# Patient Record
Sex: Female | Born: 1985 | Race: White | Hispanic: No | Smoking: Current every day smoker
Health system: Southern US, Community
[De-identification: ages and names within clinical notes are randomized; demographics above are authoritative.]

## PROBLEM LIST (undated history)

## (undated) DIAGNOSIS — G8929 Other chronic pain: Secondary | ICD-10-CM

## (undated) DIAGNOSIS — M549 Dorsalgia, unspecified: Secondary | ICD-10-CM

## (undated) HISTORY — PX: TUBAL LIGATION: SHX77

## (undated) HISTORY — PX: VAGINA SURGERY: SHX829

---

## 2006-03-14 ENCOUNTER — Emergency Department (HOSPITAL_COMMUNITY): Admission: EM | Admit: 2006-03-14 | Discharge: 2006-03-14 | Payer: Self-pay | Admitting: Emergency Medicine

## 2006-03-15 ENCOUNTER — Emergency Department (HOSPITAL_COMMUNITY): Admission: EM | Admit: 2006-03-15 | Discharge: 2006-03-15 | Payer: Self-pay | Admitting: Emergency Medicine

## 2011-12-12 ENCOUNTER — Emergency Department (HOSPITAL_BASED_OUTPATIENT_CLINIC_OR_DEPARTMENT_OTHER)
Admission: EM | Admit: 2011-12-12 | Discharge: 2011-12-12 | Disposition: A | Discharge status: Home / Self Care | Payer: Self-pay | Attending: Emergency Medicine | Admitting: Emergency Medicine

## 2011-12-12 ENCOUNTER — Encounter (HOSPITAL_BASED_OUTPATIENT_CLINIC_OR_DEPARTMENT_OTHER): Payer: Self-pay | Admitting: *Deleted

## 2011-12-12 ENCOUNTER — Emergency Department (INDEPENDENT_AMBULATORY_CARE_PROVIDER_SITE_OTHER): Payer: Self-pay

## 2011-12-12 DIAGNOSIS — R222 Localized swelling, mass and lump, trunk: Secondary | ICD-10-CM

## 2011-12-12 DIAGNOSIS — R0781 Pleurodynia: Secondary | ICD-10-CM

## 2011-12-12 DIAGNOSIS — J45909 Unspecified asthma, uncomplicated: Secondary | ICD-10-CM | POA: Insufficient documentation

## 2011-12-12 DIAGNOSIS — R079 Chest pain, unspecified: Secondary | ICD-10-CM | POA: Insufficient documentation

## 2011-12-12 LAB — PREGNANCY, URINE: Preg Test, Ur: NEGATIVE

## 2011-12-12 NOTE — ED Provider Notes (Signed)
Medical screening examination/treatment/procedure(s) were performed by non-physician practitioner and as supervising physician I was immediately available for consultation/collaboration.  Cambelle Suchecki, MD 12/12/11 2357 

## 2011-12-12 NOTE — ED Notes (Signed)
Pt has lump over sternum x 1.5 months- has been evaluated by other emergency services without resolution

## 2011-12-12 NOTE — ED Provider Notes (Signed)
History     CSN: [id number]  Arrival date & time 12/12/11  1906   First MD Initiated Contact with Patient 12/12/11 2039      Chief Complaint  Patient presents with  . Mass    (Consider location/radiation/quality/duration/timing/severity/associated sxs/prior treatment) HPI Comments: Pt states that she has had a lump over the left lower rib at the base of the sternum:pt states that she noticed it about 1.5 months ago:pt states that it is tender  The history is provided by the patient. No language interpreter was used.    Past Medical History  Diagnosis Date  . Asthma     Past Surgical History  Procedure Date  . Vagina surgery     No family history on file.  History  Substance Use Topics  . Smoking status: Current Everyday Smoker  . Smokeless tobacco: Never Used  . Alcohol Use: Yes     1 x month    OB History    Grav Para Term Preterm Abortions TAB SAB Ect Mult Living                  Review of Systems  Constitutional: Negative.   Respiratory: Negative.  Negative for cough and shortness of breath.   Cardiovascular: Negative.   Neurological: Negative.     Allergies  Review of patient's allergies indicates no known allergies.  Home Medications  No current outpatient prescriptions on file.  BP 109/69  Pulse 92  Temp(Src) 99 F (37.2 C) (Oral)  Resp 18  Wt 97 lb 14.4 oz (44.407 kg)  SpO2 100%  Physical Exam  Nursing note and vitals reviewed. Constitutional: She appears well-developed and well-nourished.  HENT:  Head: Normocephalic and atraumatic.  Cardiovascular: Normal rate and regular rhythm.   Pulmonary/Chest: Effort normal and breath sounds normal.       Pt has a more significant bone noted to the left lower rib near the base of the sternum  Abdominal: Soft. Bowel sounds are normal. There is no tenderness.  Skin: Skin is warm and dry.    ED Course  Procedures (including critical care time)   Labs Reviewed  PREGNANCY, URINE   Dg Ribs  Unilateral W/chest Left  12/12/2011  *RADIOLOGY REPORT*  Clinical Data: Mass palpated in the mid left chest., marked with a BB.  LEFT RIBS AND CHEST - 3+ VIEW  Comparison: Chest 11/22/2011  Findings: Normal heart size and pulmonary vascularity.  No focal airspace consolidation in the lungs.  No blunting of costophrenic angles.  No pneumothorax.  Left ribs appear intact.  No evidence of acute displaced fracture. No abnormal expansile changes.  No focal bone lesion or bone destruction.  No focal abnormalities demonstrated in the area of the BB marker to account for the lump.  IMPRESSION: No evidence of active pulmonary disease.  Left ribs appear intact. No radiopaque abnormality demonstrated to account for the palpable lesion.  The lesion remains radiographically indeterminate.  Original Report Authenticated By: Marlon Pel, M.D.     1. Rib pain       MDM  No lesion noted on x-ray:pt instructed to do ibuprofen at home:likely a normal variant for pt        Teressa Lower, NP 12/12/11 2159

## 2016-09-03 ENCOUNTER — Emergency Department (HOSPITAL_COMMUNITY)
Admission: EM | Admit: 2016-09-03 | Discharge: 2016-09-03 | Disposition: A | Discharge status: Home / Self Care | Payer: Self-pay | Attending: Emergency Medicine | Admitting: Emergency Medicine

## 2016-09-03 ENCOUNTER — Encounter (HOSPITAL_COMMUNITY): Payer: Self-pay | Admitting: Nurse Practitioner

## 2016-09-03 DIAGNOSIS — K029 Dental caries, unspecified: Secondary | ICD-10-CM

## 2016-09-03 DIAGNOSIS — F17200 Nicotine dependence, unspecified, uncomplicated: Secondary | ICD-10-CM | POA: Insufficient documentation

## 2016-09-03 DIAGNOSIS — K0889 Other specified disorders of teeth and supporting structures: Secondary | ICD-10-CM

## 2016-09-03 DIAGNOSIS — J45909 Unspecified asthma, uncomplicated: Secondary | ICD-10-CM | POA: Insufficient documentation

## 2016-09-03 MED ORDER — NAPROXEN 500 MG PO TABS: 500.0000 mg | ORAL_TABLET | Freq: Two times a day (BID) | ORAL | 0 refills | Status: DC

## 2016-09-03 MED ORDER — BUPIVACAINE-EPINEPHRINE (PF) 0.5% -1:200000 IJ SOLN
1.8000 mL | Freq: Once | INTRAMUSCULAR | Status: AC
Start: 2016-09-03 — End: 2016-09-03
  Administered 2016-09-03: 1.8 mL

## 2016-09-03 NOTE — Discharge Instructions (Signed)
Please follow up with a Dentist for further evaluation and management.   Call [phone] for Emergent Dental Care  Check this website for free, low-income or sliding scale dental services in [place]. www.freedental.us   To find a dentist in the [place] or surrounding areas check this website: [email or web address]  Return to the ED for fever, chills, facial swelling, purulent drainage, tongue swelling, or any new or concerning symptoms.

## 2016-09-03 NOTE — ED Triage Notes (Signed)
Pt presents with c/o dental pain. The pain began yesterday. She has tried several OTC pain remedies with no relief. She does have a dentist she will call tomorrow to schedule an appointment with them

## 2016-09-03 NOTE — ED Provider Notes (Signed)
[facility]-EMERGENCY DEPT Provider Note   CSN: [id number] Arrival date & time: 09/03/16  1813  By signing my name below, I, Marissa Bolton, attest that this documentation has been prepared under the direction and in the presence of Marissa FowlerKayla Norina Cowper, PA-C Electronically Signed: Soijett Bolton, ED Scribe. 09/03/16. 6:44 PM.  History   Chief Complaint Chief Complaint  Patient presents with  . Dental Pain    HPI Marissa Bolton is a 30 y.o. female who presents to the Emergency Department complaining of right lower dental pain onset yesterday. Pt notes that she has not been able to obtain a dental appointment for evaluation of her symptoms. She has tried goody powder, ibuprofen, oragel, and advil, with no relief of her symptoms. Pt last dose of ibuprofen was 1 hour ago PTA. She denies fever, chills, facial swelling, drainage, and any other symptoms.    The history is provided by the patient. No language interpreter was used.    Past Medical History:  Diagnosis Date  . Asthma     There are no active problems to display for this patient.   Past Surgical History:  Procedure Laterality Date  . TUBAL LIGATION    . VAGINA SURGERY      OB History    No data available       Home Medications    Prior to Admission medications   Medication Sig Start Date End Date Taking? Authorizing Provider  naproxen (NAPROSYN) 500 MG tablet Take 1 tablet (500 mg total) by mouth 2 (two) times daily. 09/03/16   Marissa FowlerKayla Silviano Neuser, PA-C    Family History History reviewed. No pertinent family history.  Social History Social History  Substance Use Topics  . Smoking status: Current Every Day Smoker  . Smokeless tobacco: Never Used  . Alcohol use Yes     Comment: 1 x month     Allergies   Patient has no known allergies.   Review of Systems Review of Systems  Constitutional: Negative for chills and fever.  HENT: Positive for dental problem (right lower back). Negative for facial swelling.        No drainage  from the affected area     Physical Exam Updated Vital Signs BP 128/73   Pulse 76   Temp 98 F (36.7 C) (Oral)   Resp 17   SpO2 100%   Physical Exam  Constitutional: She is oriented to person, place, and time. She appears well-developed and well-nourished.  HENT:  Head: Normocephalic and atraumatic.  Mouth/Throat: Uvula is midline, oropharynx is clear and moist and mucous membranes are normal. No trismus in the jaw. Abnormal dentition. Dental caries present. No dental abscesses.  Tooth #29, pt reports pain without obvious abscess. No tongue swelling or facial swelling.  No gingival erythema or swelling.  Airway patent. Patient tolerating secretions without difficulty.   Eyes: Conjunctivae are normal.  Neck: Normal range of motion. Neck supple.  No evidence of Ludwigs angina.  Cardiovascular: Normal rate, regular rhythm and normal heart sounds.   Pulmonary/Chest: Effort normal and breath sounds normal.  Abdominal: Bowel sounds are normal. She exhibits no distension.  Musculoskeletal:  Moves all extremities spontaneously.  Lymphadenopathy:    She has no cervical adenopathy.  Neurological: She is alert and oriented to person, place, and time.  Speech clear without dysarthria.   Skin: Skin is warm and dry.     ED Treatments / Results  DIAGNOSTIC STUDIES: Oxygen Saturation is 100% on RA, nl by my interpretation.  COORDINATION OF CARE: 6:30 PM Discussed treatment plan with pt at bedside which includes dental block, naprosyn Rx, and pt agreed to plan.   Procedures Dental Block Date/Time: 09/03/2016 6:38 PM Performed by: Marissa FowlerOSE, Josanne Boerema Authorized by: Marissa FowlerOSE, Tayquan Gassman   Consent:    Consent obtained:  Verbal   Consent given by:  Patient   Risks discussed:  Pain   Alternatives discussed:  Alternative treatment Indications:    Indications: dental pain   Location:    Block type:  Inferior alveolar   Laterality:  Right Procedure details (see MAR for exact dosages):    Syringe  type:  Aspirating dental syringe   Needle gauge:  27 G   Anesthetic injected:  Bupivacaine 0.5% WITH epi (1.8 ml used)   Injection procedure:  Anatomic landmarks identified, anatomic landmarks palpated and negative aspiration for blood Post-procedure details:    Outcome:  Anesthesia achieved   Patient tolerance of procedure:  Tolerated well, no immediate complications     (including critical care time)  Medications Ordered in ED Medications  bupivacaine-epinephrine (MARCAINE W/ EPI) 0.5% -1:200000 injection 1.8 mL (1.8 mLs Infiltration Given 09/03/16 1844)     Initial Impression / Assessment and Plan / ED Course  I have reviewed the triage vital signs and the nursing notes.   Clinical Course    Patient with dentalgia. No abscess requiring immediate incision and drainage.  No signs of infection.  Exam not concerning for Ludwig's angina or pharyngeal abscess.  Will treat with dental block while in the ED. Pt will be discharged home with naprosyn Rx. I do not feel antibiotics are warranted as there are no signs of infection.  Pt instructed to follow-up with dentist.  She is also given emergency dentist resources as well.  Discussed return precautions. Pt safe for discharge.  Final Clinical Impressions(s) / ED Diagnoses   Final diagnoses:  Dental caries  Pain, dental    New Prescriptions New Prescriptions   NAPROXEN (NAPROSYN) 500 MG TABLET    Take 1 tablet (500 mg total) by mouth 2 (two) times daily.   I personally performed the services described in this documentation, which was scribed in my presence. The recorded information has been reviewed and is accurate.     Marissa FowlerKayla Blessin Kanno, PA-C 09/03/16 [id number]    Rolland PorterMark James, MD 09/09/16 [id number]

## 2016-09-03 NOTE — ED Notes (Signed)
Pt is in stable condition upon d/c and ambulates from ED. 

## 2016-11-03 ENCOUNTER — Inpatient Hospital Stay (HOSPITAL_COMMUNITY): Payer: Medicaid Other

## 2016-11-03 ENCOUNTER — Emergency Department (HOSPITAL_COMMUNITY): Payer: Medicaid Other | Admitting: Certified Registered Nurse Anesthetist

## 2016-11-03 ENCOUNTER — Encounter (HOSPITAL_COMMUNITY): Admission: EM | Disposition: A | Discharge status: Home / Self Care | Payer: Self-pay | Source: Home / Self Care

## 2016-11-03 ENCOUNTER — Inpatient Hospital Stay (HOSPITAL_COMMUNITY)
Admission: EM | Admit: 2016-11-03 | Discharge: 2016-11-16 | DRG: 799 | Disposition: A | Discharge status: Home / Self Care | Payer: Medicaid Other | Attending: General Surgery | Admitting: General Surgery

## 2016-11-03 DIAGNOSIS — R451 Restlessness and agitation: Secondary | ICD-10-CM | POA: Diagnosis not present

## 2016-11-03 DIAGNOSIS — S82142A Displaced bicondylar fracture of left tibia, initial encounter for closed fracture: Secondary | ICD-10-CM | POA: Diagnosis present

## 2016-11-03 DIAGNOSIS — J9811 Atelectasis: Secondary | ICD-10-CM | POA: Diagnosis not present

## 2016-11-03 DIAGNOSIS — I959 Hypotension, unspecified: Secondary | ICD-10-CM | POA: Diagnosis present

## 2016-11-03 DIAGNOSIS — Z9889 Other specified postprocedural states: Secondary | ICD-10-CM

## 2016-11-03 DIAGNOSIS — S82209A Unspecified fracture of shaft of unspecified tibia, initial encounter for closed fracture: Secondary | ICD-10-CM

## 2016-11-03 DIAGNOSIS — R059 Cough, unspecified: Secondary | ICD-10-CM

## 2016-11-03 DIAGNOSIS — F419 Anxiety disorder, unspecified: Secondary | ICD-10-CM | POA: Diagnosis present

## 2016-11-03 DIAGNOSIS — Y92410 Unspecified street and highway as the place of occurrence of the external cause: Secondary | ICD-10-CM | POA: Diagnosis not present

## 2016-11-03 DIAGNOSIS — Z419 Encounter for procedure for purposes other than remedying health state, unspecified: Secondary | ICD-10-CM

## 2016-11-03 DIAGNOSIS — Z01818 Encounter for other preprocedural examination: Secondary | ICD-10-CM

## 2016-11-03 DIAGNOSIS — R109 Unspecified abdominal pain: Secondary | ICD-10-CM | POA: Diagnosis present

## 2016-11-03 DIAGNOSIS — D62 Acute posthemorrhagic anemia: Secondary | ICD-10-CM | POA: Diagnosis present

## 2016-11-03 DIAGNOSIS — J45909 Unspecified asthma, uncomplicated: Secondary | ICD-10-CM | POA: Diagnosis present

## 2016-11-03 DIAGNOSIS — F17200 Nicotine dependence, unspecified, uncomplicated: Secondary | ICD-10-CM | POA: Diagnosis present

## 2016-11-03 DIAGNOSIS — N760 Acute vaginitis: Secondary | ICD-10-CM | POA: Diagnosis not present

## 2016-11-03 DIAGNOSIS — S36039A Unspecified laceration of spleen, initial encounter: Secondary | ICD-10-CM | POA: Diagnosis present

## 2016-11-03 DIAGNOSIS — Z1630 Resistance to unspecified antimicrobial drugs: Secondary | ICD-10-CM | POA: Diagnosis not present

## 2016-11-03 DIAGNOSIS — J9690 Respiratory failure, unspecified, unspecified whether with hypoxia or hypercapnia: Secondary | ICD-10-CM

## 2016-11-03 DIAGNOSIS — S82201A Unspecified fracture of shaft of right tibia, initial encounter for closed fracture: Secondary | ICD-10-CM

## 2016-11-03 DIAGNOSIS — D72829 Elevated white blood cell count, unspecified: Secondary | ICD-10-CM

## 2016-11-03 DIAGNOSIS — J95821 Acute postprocedural respiratory failure: Secondary | ICD-10-CM | POA: Diagnosis not present

## 2016-11-03 DIAGNOSIS — B9689 Other specified bacterial agents as the cause of diseases classified elsewhere: Secondary | ICD-10-CM | POA: Diagnosis not present

## 2016-11-03 DIAGNOSIS — N390 Urinary tract infection, site not specified: Secondary | ICD-10-CM | POA: Diagnosis not present

## 2016-11-03 DIAGNOSIS — R52 Pain, unspecified: Secondary | ICD-10-CM

## 2016-11-03 DIAGNOSIS — R40241 Glasgow coma scale score 13-15, unspecified time: Secondary | ICD-10-CM | POA: Diagnosis present

## 2016-11-03 DIAGNOSIS — S82244A Nondisplaced spiral fracture of shaft of right tibia, initial encounter for closed fracture: Secondary | ICD-10-CM

## 2016-11-03 DIAGNOSIS — S82141A Displaced bicondylar fracture of right tibia, initial encounter for closed fracture: Secondary | ICD-10-CM | POA: Diagnosis present

## 2016-11-03 DIAGNOSIS — M25562 Pain in left knee: Secondary | ICD-10-CM

## 2016-11-03 DIAGNOSIS — E876 Hypokalemia: Secondary | ICD-10-CM | POA: Diagnosis not present

## 2016-11-03 DIAGNOSIS — T1490XA Injury, unspecified, initial encounter: Secondary | ICD-10-CM | POA: Diagnosis present

## 2016-11-03 DIAGNOSIS — N939 Abnormal uterine and vaginal bleeding, unspecified: Secondary | ICD-10-CM | POA: Diagnosis present

## 2016-11-03 DIAGNOSIS — T148XXA Other injury of unspecified body region, initial encounter: Secondary | ICD-10-CM

## 2016-11-03 DIAGNOSIS — Z23 Encounter for immunization: Secondary | ICD-10-CM

## 2016-11-03 DIAGNOSIS — R05 Cough: Secondary | ICD-10-CM

## 2016-11-03 DIAGNOSIS — B9620 Unspecified Escherichia coli [E. coli] as the cause of diseases classified elsewhere: Secondary | ICD-10-CM | POA: Diagnosis not present

## 2016-11-03 DIAGNOSIS — Z452 Encounter for adjustment and management of vascular access device: Secondary | ICD-10-CM

## 2016-11-03 DIAGNOSIS — M25462 Effusion, left knee: Secondary | ICD-10-CM

## 2016-11-03 DIAGNOSIS — S82122A Displaced fracture of lateral condyle of left tibia, initial encounter for closed fracture: Secondary | ICD-10-CM | POA: Diagnosis not present

## 2016-11-03 DIAGNOSIS — R079 Chest pain, unspecified: Secondary | ICD-10-CM

## 2016-11-03 DIAGNOSIS — R000 Tachycardia, unspecified: Secondary | ICD-10-CM

## 2016-11-03 HISTORY — DX: Dorsalgia, unspecified: M54.9

## 2016-11-03 HISTORY — PX: SPLENECTOMY, TOTAL: SHX788

## 2016-11-03 HISTORY — PX: LAPAROTOMY: SHX154

## 2016-11-03 HISTORY — DX: Other chronic pain: G89.29

## 2016-11-03 LAB — POCT I-STAT 7, (LYTES, BLD GAS, ICA,H+H)
ACID-BASE DEFICIT: 7 mmol/L — AB (ref 0.0–2.0)
Acid-base deficit: 5 mmol/L — ABNORMAL HIGH (ref 0.0–2.0)
BICARBONATE: 20.8 mmol/L (ref 20.0–28.0)
Bicarbonate: 18.5 mmol/L — ABNORMAL LOW (ref 20.0–28.0)
CALCIUM ION: 0.3 mmol/L — AB (ref 1.15–1.40)
Calcium, Ion: 0.62 mmol/L — CL (ref 1.15–1.40)
HCT: 19 % — ABNORMAL LOW (ref 36.0–46.0)
HEMATOCRIT: 20 % — AB (ref 36.0–46.0)
Hemoglobin: 6.5 g/dL — CL (ref 12.0–15.0)
Hemoglobin: 6.8 g/dL — CL (ref 12.0–15.0)
O2 SAT: 100 %
O2 Saturation: 100 %
PCO2 ART: 35.2 mmHg (ref 32.0–48.0)
PO2 ART: 580 mmHg — AB (ref 83.0–108.0)
POTASSIUM: 3.9 mmol/L (ref 3.5–5.1)
Patient temperature: 34.3
Potassium: 3.8 mmol/L (ref 3.5–5.1)
Sodium: 144 mmol/L (ref 135–145)
Sodium: 145 mmol/L (ref 135–145)
TCO2: 20 mmol/L (ref 0–100)
TCO2: 22 mmol/L (ref 0–100)
pCO2 arterial: 30.9 mmHg — ABNORMAL LOW (ref 32.0–48.0)
pH, Arterial: 7.367 (ref 7.350–7.450)
pH, Arterial: 7.376 (ref 7.350–7.450)
pO2, Arterial: 549 mmHg — ABNORMAL HIGH (ref 83.0–108.0)

## 2016-11-03 LAB — COMPREHENSIVE METABOLIC PANEL
ALBUMIN: 2 g/dL — AB (ref 3.5–5.0)
ALK PHOS: 28 U/L — AB (ref 38–126)
ALT: 43 U/L (ref 14–54)
AST: 50 U/L — AB (ref 15–41)
Anion gap: 17 — ABNORMAL HIGH (ref 5–15)
BILIRUBIN TOTAL: 0.4 mg/dL (ref 0.3–1.2)
BUN: 11 mg/dL (ref 6–20)
CO2: 15 mmol/L — ABNORMAL LOW (ref 22–32)
CREATININE: 0.59 mg/dL (ref 0.44–1.00)
Calcium: 6.5 mg/dL — ABNORMAL LOW (ref 8.9–10.3)
Chloride: 112 mmol/L — ABNORMAL HIGH (ref 101–111)
GFR calc Af Amer: >60 mL/min (ref >60)
GLUCOSE: 219 mg/dL — AB (ref 65–99)
Potassium: 3.9 mmol/L (ref 3.5–5.1)
Sodium: 144 mmol/L (ref 135–145)
TOTAL PROTEIN: 3.2 g/dL — AB (ref 6.5–8.1)

## 2016-11-03 LAB — PROTIME-INR
INR: 1.47
PROTHROMBIN TIME: 18 s — AB (ref 11.4–15.2)

## 2016-11-03 LAB — CBC
HEMATOCRIT: 27.5 % — AB (ref 36.0–46.0)
Hemoglobin: 8.8 g/dL — ABNORMAL LOW (ref 12.0–15.0)
MCH: 25.7 pg — ABNORMAL LOW (ref 26.0–34.0)
MCHC: 32 g/dL (ref 30.0–36.0)
MCV: 80.4 fL (ref 78.0–100.0)
PLATELETS: 202 10*3/uL (ref 150–400)
RBC: 3.42 MIL/uL — ABNORMAL LOW (ref 3.87–5.11)
RDW: 16.1 % — ABNORMAL HIGH (ref 11.5–15.5)
WBC: 15.9 10*3/uL — AB (ref 4.0–10.5)

## 2016-11-03 LAB — ETHANOL: Alcohol, Ethyl (B): <5 mg/dL (ref <5)

## 2016-11-03 LAB — LACTIC ACID, PLASMA: LACTIC ACID, VENOUS: 3.1 mmol/L — AB (ref 0.5–1.9)

## 2016-11-03 LAB — MRSA PCR SCREENING: MRSA by PCR: NEGATIVE

## 2016-11-03 SURGERY — LAPAROTOMY, EXPLORATORY
Anesthesia: General | Site: Abdomen

## 2016-11-03 MED ORDER — CHLORHEXIDINE GLUCONATE 0.12% ORAL RINSE (MEDLINE KIT)
15.0000 mL | Freq: Two times a day (BID) | OROMUCOSAL | Status: DC
  Administered 2016-11-03 – 2016-11-12 (×14): 15 mL via OROMUCOSAL

## 2016-11-03 MED ORDER — CALCIUM CHLORIDE 10 % IV SOLN
INTRAVENOUS | Status: DC | PRN
  Administered 2016-11-03 (×4): 0.5 g via INTRAVENOUS

## 2016-11-03 MED ORDER — SUCCINYLCHOLINE CHLORIDE 200 MG/10ML IV SOSY
PREFILLED_SYRINGE | INTRAVENOUS | Status: AC
  Filled 2016-11-03: qty 10

## 2016-11-03 MED ORDER — ROCURONIUM BROMIDE 50 MG/5ML IV SOSY
PREFILLED_SYRINGE | INTRAVENOUS | Status: AC
  Filled 2016-11-03: qty 5

## 2016-11-03 MED ORDER — PROPOFOL 10 MG/ML IV BOLUS
INTRAVENOUS | Status: AC
  Filled 2016-11-03: qty 20

## 2016-11-03 MED ORDER — CALCIUM CHLORIDE 10 % IV SOLN
INTRAVENOUS | Status: AC
  Filled 2016-11-03: qty 10

## 2016-11-03 MED ORDER — DOCUSATE SODIUM 50 MG/5ML PO LIQD: 100.0000 mg | Freq: Two times a day (BID) | ORAL | Status: DC | PRN

## 2016-11-03 MED ORDER — SODIUM BICARBONATE 8.4 % IV SOLN
INTRAVENOUS | Status: DC | PRN
  Administered 2016-11-03: 50 meq via INTRAVENOUS

## 2016-11-03 MED ORDER — PANTOPRAZOLE SODIUM 40 MG IV SOLR
40.0000 mg | Freq: Every day | INTRAVENOUS | Status: DC
  Administered 2016-11-03 – 2016-11-05 (×3): 40 mg via INTRAVENOUS
  Filled 2016-11-03 (×3): qty 40

## 2016-11-03 MED ORDER — SODIUM CHLORIDE 0.9 % IV SOLN
INTRAVENOUS | Status: DC | PRN
  Administered 2016-11-03: 20:00:00 via INTRAVENOUS

## 2016-11-03 MED ORDER — SODIUM CHLORIDE 0.9 % IV SOLN
INTRAVENOUS | Status: DC
  Administered 2016-11-03 – 2016-11-08 (×7): via INTRAVENOUS

## 2016-11-03 MED ORDER — MIDAZOLAM HCL 2 MG/2ML IJ SOLN
INTRAMUSCULAR | Status: AC
  Filled 2016-11-03: qty 2

## 2016-11-03 MED ORDER — ETOMIDATE 2 MG/ML IV SOLN
INTRAVENOUS | Status: DC | PRN
  Administered 2016-11-03: 12 mg via INTRAVENOUS

## 2016-11-03 MED ORDER — ONDANSETRON HCL 4 MG PO TABS: 4.0000 mg | ORAL_TABLET | Freq: Four times a day (QID) | ORAL | Status: DC | PRN

## 2016-11-03 MED ORDER — SUFENTANIL CITRATE 50 MCG/ML IV SOLN
INTRAVENOUS | Status: DC | PRN
  Administered 2016-11-03 (×2): 10 ug via INTRAVENOUS
  Administered 2016-11-03: 20 ug via INTRAVENOUS
  Administered 2016-11-03: 10 ug via INTRAVENOUS

## 2016-11-03 MED ORDER — SUFENTANIL CITRATE 50 MCG/ML IV SOLN
INTRAVENOUS | Status: AC
  Filled 2016-11-03: qty 1

## 2016-11-03 MED ORDER — LACTATED RINGERS IV SOLN
INTRAVENOUS | Status: DC | PRN
  Administered 2016-11-03 (×3): via INTRAVENOUS

## 2016-11-03 MED ORDER — CEFAZOLIN SODIUM-DEXTROSE 2-3 GM-% IV SOLR
INTRAVENOUS | Status: DC | PRN
  Administered 2016-11-03: 2 g via INTRAVENOUS

## 2016-11-03 MED ORDER — LIDOCAINE 2% (20 MG/ML) 5 ML SYRINGE
INTRAMUSCULAR | Status: AC
  Filled 2016-11-03: qty 5

## 2016-11-03 MED ORDER — FENTANYL CITRATE (PF) 100 MCG/2ML IJ SOLN
100.0000 ug | INTRAMUSCULAR | Status: AC | PRN
  Administered 2016-11-03 – 2016-11-04 (×3): 100 ug via INTRAVENOUS
  Filled 2016-11-03 (×3): qty 2

## 2016-11-03 MED ORDER — SODIUM CHLORIDE 0.9 % IJ SOLN
INTRAMUSCULAR | Status: AC
  Filled 2016-11-03: qty 10

## 2016-11-03 MED ORDER — ONDANSETRON HCL 4 MG/2ML IJ SOLN
4.0000 mg | Freq: Four times a day (QID) | INTRAMUSCULAR | Status: DC | PRN
  Administered 2016-11-07 – 2016-11-11 (×3): 4 mg via INTRAVENOUS
  Filled 2016-11-03 (×3): qty 2

## 2016-11-03 MED ORDER — PROPOFOL 1000 MG/100ML IV EMUL
0.0000 ug/kg/min | INTRAVENOUS | Status: DC
  Administered 2016-11-04 – 2016-11-05 (×4): 50 ug/kg/min via INTRAVENOUS
  Administered 2016-11-05: 25 ug/kg/min via INTRAVENOUS
  Filled 2016-11-03 (×6): qty 100

## 2016-11-03 MED ORDER — FENTANYL CITRATE (PF) 100 MCG/2ML IJ SOLN
100.0000 ug | INTRAMUSCULAR | Status: DC | PRN
  Administered 2016-11-04: 100 ug via INTRAVENOUS
  Filled 2016-11-03: qty 2

## 2016-11-03 MED ORDER — 0.9 % SODIUM CHLORIDE (POUR BTL) OPTIME
TOPICAL | Status: DC | PRN
  Administered 2016-11-03: 1000 mL

## 2016-11-03 MED ORDER — ROCURONIUM BROMIDE 100 MG/10ML IV SOLN
INTRAVENOUS | Status: DC | PRN
  Administered 2016-11-03: 50 mg via INTRAVENOUS

## 2016-11-03 MED ORDER — MIDAZOLAM HCL 2 MG/2ML IJ SOLN
INTRAMUSCULAR | Status: DC | PRN
  Administered 2016-11-03 (×2): 2 mg via INTRAVENOUS

## 2016-11-03 MED ORDER — PANTOPRAZOLE SODIUM 40 MG PO TBEC
40.0000 mg | DELAYED_RELEASE_TABLET | Freq: Every day | ORAL | Status: DC
  Administered 2016-11-06 – 2016-11-16 (×11): 40 mg via ORAL
  Filled 2016-11-03 (×11): qty 1

## 2016-11-03 MED ORDER — ORAL CARE MOUTH RINSE
15.0000 mL | Freq: Four times a day (QID) | OROMUCOSAL | Status: DC
  Administered 2016-11-04 – 2016-11-12 (×23): 15 mL via OROMUCOSAL

## 2016-11-03 MED ORDER — SUCCINYLCHOLINE CHLORIDE 20 MG/ML IJ SOLN
INTRAMUSCULAR | Status: DC | PRN
  Administered 2016-11-03: 140 mg via INTRAVENOUS

## 2016-11-03 SURGICAL SUPPLY — 44 items
BLADE SURG ROTATE 9660 (MISCELLANEOUS) IMPLANT
CANISTER SUCTION 2500CC (MISCELLANEOUS) ×3 IMPLANT
CHLORAPREP W/TINT 26ML (MISCELLANEOUS) ×3 IMPLANT
COVER SURGICAL LIGHT HANDLE (MISCELLANEOUS) ×3 IMPLANT
DRAPE LAPAROSCOPIC ABDOMINAL (DRAPES) ×3 IMPLANT
DRAPE WARM FLUID 44X44 (DRAPE) ×3 IMPLANT
DRSG OPSITE POSTOP 4X10 (GAUZE/BANDAGES/DRESSINGS) ×2 IMPLANT
DRSG OPSITE POSTOP 4X8 (GAUZE/BANDAGES/DRESSINGS) IMPLANT
ELECT BLADE 6.5 EXT (BLADE) ×2 IMPLANT
ELECT CAUTERY BLADE 6.4 (BLADE) ×2 IMPLANT
ELECT REM PT RETURN 9FT ADLT (ELECTROSURGICAL) ×3
ELECTRODE REM PT RTRN 9FT ADLT (ELECTROSURGICAL) ×1 IMPLANT
GLOVE BIO SURGEON STRL SZ7 (GLOVE) ×3 IMPLANT
GLOVE BIO SURGEON STRL SZ7.5 (GLOVE) ×4 IMPLANT
GLOVE BIOGEL M 8.0 STRL (GLOVE) ×2 IMPLANT
GLOVE BIOGEL M STRL SZ7.5 (GLOVE) ×2 IMPLANT
GLOVE BIOGEL PI IND STRL 7.5 (GLOVE) ×1 IMPLANT
GLOVE BIOGEL PI INDICATOR 7.5 (GLOVE) ×4
GLOVE BIOGEL PI ORTHO PRO SZ8 (GLOVE) ×2
GLOVE PI ORTHO PRO STRL SZ8 (GLOVE) IMPLANT
GOWN STRL REUS W/ TWL LRG LVL3 (GOWN DISPOSABLE) ×2 IMPLANT
GOWN STRL REUS W/TWL LRG LVL3 (GOWN DISPOSABLE) ×6
KIT BASIN OR (CUSTOM PROCEDURE TRAY) ×3 IMPLANT
KIT ROOM TURNOVER OR (KITS) ×3 IMPLANT
LIGASURE IMPACT 36 18CM CVD LR (INSTRUMENTS) IMPLANT
NS IRRIG 1000ML POUR BTL (IV SOLUTION) ×8 IMPLANT
PACK GENERAL/GYN (CUSTOM PROCEDURE TRAY) ×3 IMPLANT
PAD ARMBOARD 7.5X6 YLW CONV (MISCELLANEOUS) ×3 IMPLANT
SPECIMEN JAR LARGE (MISCELLANEOUS) IMPLANT
SPONGE LAP 18X18 X RAY DECT (DISPOSABLE) ×16 IMPLANT
STAPLER VISISTAT 35W (STAPLE) ×3 IMPLANT
SUCTION POOLE TIP (SUCTIONS) ×3 IMPLANT
SUT PDS AB 1 TP1 96 (SUTURE) ×6 IMPLANT
SUT SILK 2 0 (SUTURE) ×3
SUT SILK 2 0 SH CR/8 (SUTURE) ×5 IMPLANT
SUT SILK 2-0 18XBRD TIE 12 (SUTURE) ×1 IMPLANT
SUT SILK 3 0 (SUTURE) ×3
SUT SILK 3 0 SH CR/8 (SUTURE) ×3 IMPLANT
SUT SILK 3-0 18XBRD TIE 12 (SUTURE) ×1 IMPLANT
SUT VIC AB 3-0 SH 27 (SUTURE)
SUT VIC AB 3-0 SH 27X BRD (SUTURE) IMPLANT
TOWEL OR 17X26 10 PK STRL BLUE (TOWEL DISPOSABLE) ×3 IMPLANT
TRAY FOLEY CATH 16FRSI W/METER (SET/KITS/TRAYS/PACK) ×3 IMPLANT
YANKAUER SUCT BULB TIP NO VENT (SUCTIONS) IMPLANT

## 2016-11-03 NOTE — ED Notes (Signed)
One black shoe, 1 white sock and 1 silver colored ring with clear stones given to pt's husband also 86 cents in change given to him as well
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
## 2016-11-03 NOTE — Anesthesia Procedure Notes (Deleted)
Procedures

## 2016-11-03 NOTE — ED Notes (Signed)
Pt to the OR at this time.  Dr. Rondel BatonWakefied st's labs can be drawn in the OR

## 2016-11-03 NOTE — Progress Notes (Signed)
Orthopedic Tech Progress Note Patient Details:  Marissa CowmanBobbie Jo Bolton 1986/08/29 [id number]  Ortho Devices Type of Ortho Device: Ace wrap, Post (long leg) splint Ortho Device/Splint Location: RLE Ortho Device/Splint Interventions: Ordered, Application   Jennye MoccasinHughes, Issac Moure Craig 11/03/2016, 9:32 PM

## 2016-11-03 NOTE — ED Notes (Signed)
Fast exam positive by Dr;. Charm BargesButler

## 2016-11-03 NOTE — Transfer of Care (Signed)
Immediate Anesthesia Transfer of Care Note  Patient: Marissa CowmanBobbie Jo Bolton  Procedure(s) Performed: Procedure(s): EXPLORATORY LAPAROTOMY (N/A) SPLENECTOMY (N/A)  Patient Location: ICU  Anesthesia Type:General  Level of Consciousness: Patient remains intubated per anesthesia plan  Airway & Oxygen Therapy: Patient remains intubated per anesthesia plan and Patient placed on Ventilator (see vital sign flow sheet for setting)  Post-op Assessment: Report given to RN and Post -op Vital signs reviewed and stable  Post vital signs: Reviewed and stable  Last Vitals:  Vitals:   11/03/16 1916 11/03/16 1929  BP: (!) 92/50 101/60  Pulse: (!) 160   Resp: (!) 24 20  Temp: 36.9 C     Last Pain:  Vitals:   11/03/16 2004  TempSrc:   PainSc: 10-Worst pain ever         Complications: No apparent anesthesia complications

## 2016-11-03 NOTE — Anesthesia Postprocedure Evaluation (Signed)
Anesthesia Post Note  Patient: Marissa Bolton  Procedure(s) Performed: Procedure(s) (LRB): EXPLORATORY LAPAROTOMY (N/A) SPLENECTOMY (N/A)  Patient location during evaluation: NICU Anesthesia Type: General Level of consciousness: patient remains intubated per anesthesia plan and sedated Pain management: pain level controlled Vital Signs Assessment: post-procedure vital signs reviewed and stable Respiratory status: patient connected to T-piece oxygen, patient on ventilator - see flowsheet for VS and patient remains intubated per anesthesia plan Cardiovascular status: blood pressure returned to baseline Anesthetic complications: no       Last Vitals:  Vitals:   11/03/16 1916 11/03/16 1929  BP: (!) 92/50 101/60  Pulse: (!) 160   Resp: (!) 24 20  Temp: 36.9 C     Last Pain:  Vitals:   11/03/16 2004  TempSrc:   PainSc: 10-Worst pain ever                 Barret Esquivel COKER
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
## 2016-11-03 NOTE — Anesthesia Preprocedure Evaluation (Signed)
Anesthesia Evaluation  Patient identified by MRN, date of birth, ID band Patient awake    Reviewed: Unable to perform ROS - Chart review onlyPreop documentation limited or incomplete due to emergent nature of procedure.  Airway Mallampati: II   Neck ROM: Limited    Dental  (+) Partial Upper, Edentulous Lower   Pulmonary Current Smoker,    + rhonchi        Cardiovascular  Rhythm:Regular Rate:Tachycardia     Neuro/Psych    GI/Hepatic   Endo/Other    Renal/GU      Musculoskeletal   Abdominal   Peds  Hematology   Anesthesia Other Findings   Reproductive/Obstetrics                             Anesthesia Physical Anesthesia Plan  ASA: IV  Anesthesia Plan: General   Post-op Pain Management:    Induction: Intravenous, Rapid sequence and Cricoid pressure planned  Airway Management Planned: Oral ETT  Additional Equipment: Arterial line and CVP  Intra-op Plan:   Post-operative Plan: Post-operative intubation/ventilation  Informed Consent: I have reviewed the patients History and Physical, chart, labs and discussed the procedure including the risks, benefits and alternatives for the proposed anesthesia with the patient or authorized representative who has indicated his/her understanding and acceptance.     Plan Discussed with: CRNA and Anesthesiologist  Anesthesia Plan Comments:         Anesthesia Quick Evaluation

## 2016-11-03 NOTE — Anesthesia Procedure Notes (Signed)
Central Venous Catheter Insertion Performed by: Kipp BroodJOSLIN, Beauden Tremont, anesthesiologist Start/End2/20/2018 9:45 PM, 11/03/2016 9:50 AM Patient location: OR. Preanesthetic checklist: patient identified, IV checked, site marked, risks and benefits discussed, surgical consent, monitors and equipment checked, pre-op evaluation and timeout performed Position: Trendelenburg Hand hygiene performed , maximum sterile barriers used  and Seldinger technique used Catheter size: 8 Fr Total catheter length 17. Central line was placed.Double lumen Procedure performed without using ultrasound guided technique. Attempts: 1 Following insertion, line sutured, dressing applied and Biopatch. Post procedure assessment: blood return through all ports, free fluid flow and no air  Patient tolerated the procedure well with no immediate complications.

## 2016-11-03 NOTE — ED Notes (Signed)
2 units emergency release blood infusing via rapid infusor

## 2016-11-03 NOTE — Progress Notes (Signed)
Orthopedic Tech Progress Note Patient Details:  Marissa Bolton 1985-10-21 [id number] Level 1 trauma ortho visit Patient ID: Marissa Bolton, female   DOB: 1985-10-21, 31 y.o.   MRN: [id number]   Marissa Bolton, Marissa Bolton Craig 11/03/2016, 7:35 PM

## 2016-11-03 NOTE — Anesthesia Procedure Notes (Signed)
Procedure Name: Intubation Date/Time: 11/03/2016 7:40 PM Performed by: Mosie Epstein Pre-anesthesia Checklist: Patient identified, Emergency Drugs available, Suction available, Patient being monitored and Timeout performed Patient Re-evaluated:Patient Re-evaluated prior to inductionOxygen Delivery Method: Circle system utilized Preoxygenation: Pre-oxygenation with 100% oxygen Intubation Type: IV induction, Rapid sequence and Cricoid Pressure applied Laryngoscope Size: Mac and 3 Tube size: 7.5 mm Number of attempts: 1 Airway Equipment and Method: Stylet Placement Confirmation: ETT inserted through vocal cords under direct vision,  breath sounds checked- equal and bilateral and positive ETCO2 Secured at: 21 cm Tube secured with: Tape Dental Injury: Teeth and Oropharynx as per pre-operative assessment
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
## 2016-11-04 ENCOUNTER — Encounter (HOSPITAL_COMMUNITY): Payer: Self-pay | Admitting: General Surgery

## 2016-11-04 ENCOUNTER — Inpatient Hospital Stay (HOSPITAL_COMMUNITY): Payer: Medicaid Other | Admitting: Certified Registered Nurse Anesthetist

## 2016-11-04 ENCOUNTER — Inpatient Hospital Stay (HOSPITAL_COMMUNITY): Payer: Medicaid Other

## 2016-11-04 ENCOUNTER — Encounter (HOSPITAL_COMMUNITY): Admission: EM | Disposition: A | Discharge status: Home / Self Care | Payer: Self-pay | Source: Home / Self Care

## 2016-11-04 DIAGNOSIS — S82141A Displaced bicondylar fracture of right tibia, initial encounter for closed fracture: Secondary | ICD-10-CM

## 2016-11-04 HISTORY — PX: ORIF TIBIA PLATEAU: SHX2132

## 2016-11-04 LAB — CBC
HCT: 36.3 % (ref 36.0–46.0)
HCT: 37.7 % (ref 36.0–46.0)
HEMATOCRIT: 36.6 % (ref 36.0–46.0)
HEMATOCRIT: 36.3 % (ref 36.0–46.0)
HEMOGLOBIN: 12.3 g/dL (ref 12.0–15.0)
HEMOGLOBIN: 12.3 g/dL (ref 12.0–15.0)
Hemoglobin: 12.6 g/dL (ref 12.0–15.0)
Hemoglobin: 12.7 g/dL (ref 12.0–15.0)
MCH: 27.5 pg (ref 26.0–34.0)
MCH: 27.8 pg (ref 26.0–34.0)
MCH: 28.1 pg (ref 26.0–34.0)
MCH: 28.4 pg (ref 26.0–34.0)
MCHC: 33.9 g/dL (ref 30.0–36.0)
MCHC: 33.6 g/dL (ref 30.0–36.0)
MCHC: 33.7 g/dL (ref 30.0–36.0)
MCHC: 34.7 g/dL (ref 30.0–36.0)
MCV: 81.8 fL (ref 78.0–100.0)
MCV: 82.8 fL (ref 78.0–100.0)
MCV: 82.9 fL (ref 78.0–100.0)
MCV: 81.8 fL (ref 78.0–100.0)
PLATELETS: 106 10*3/uL — AB (ref 150–400)
PLATELETS: 108 10*3/uL — AB (ref 150–400)
Platelets: 119 10*3/uL — ABNORMAL LOW (ref 150–400)
Platelets: 120 10*3/uL — ABNORMAL LOW (ref 150–400)
RBC: 4.38 MIL/uL (ref 3.87–5.11)
RBC: 4.42 MIL/uL (ref 3.87–5.11)
RBC: 4.44 MIL/uL (ref 3.87–5.11)
RBC: 4.61 MIL/uL (ref 3.87–5.11)
RDW: 16.3 % — ABNORMAL HIGH (ref 11.5–15.5)
RDW: 16.8 % — ABNORMAL HIGH (ref 11.5–15.5)
RDW: 15.7 % — AB (ref 11.5–15.5)
RDW: 16.1 % — AB (ref 11.5–15.5)
WBC: 14.2 10*3/uL — ABNORMAL HIGH (ref 4.0–10.5)
WBC: 14.5 10*3/uL — AB (ref 4.0–10.5)
WBC: 16.4 10*3/uL — ABNORMAL HIGH (ref 4.0–10.5)
WBC: 13.3 10*3/uL — AB (ref 4.0–10.5)

## 2016-11-04 LAB — TYPE AND SCREEN
BLOOD PRODUCT EXPIRATION DATE: 201803122359
BLOOD PRODUCT EXPIRATION DATE: 201803122359
BLOOD PRODUCT EXPIRATION DATE: 201803152359
BLOOD PRODUCT EXPIRATION DATE: 201803212359
BLOOD PRODUCT EXPIRATION DATE: 201803222359
BLOOD PRODUCT EXPIRATION DATE: 201803222359
BLOOD PRODUCT EXPIRATION DATE: 201803222359
Blood Product Expiration Date: 201803132359
Blood Product Expiration Date: 201803192359
Blood Product Expiration Date: 201803222359
Blood Product Expiration Date: 201803232359
Blood Product Expiration Date: 201803242359
ISSUE DATE / TIME: 201802201956
ISSUE DATE / TIME: 201802202020
ISSUE DATE / TIME: 201802202020
ISSUE DATE / TIME: 201802201908
ISSUE DATE / TIME: 201802201908
ISSUE DATE / TIME: 201802201956
ISSUE DATE / TIME: 201802202020
ISSUE DATE / TIME: 201802202020
UNIT TYPE AND RH: 600
UNIT TYPE AND RH: 600
UNIT TYPE AND RH: 600
UNIT TYPE AND RH: 9500
UNIT TYPE AND RH: 9500
Unit Type and Rh: 600
Unit Type and Rh: 600
Unit Type and Rh: 600
Unit Type and Rh: 600
Unit Type and Rh: 600
Unit Type and Rh: 9500
Unit Type and Rh: 9500

## 2016-11-04 LAB — BASIC METABOLIC PANEL
ANION GAP: 5 (ref 5–15)
Anion gap: 9 (ref 5–15)
BUN: 10 mg/dL (ref 6–20)
BUN: 8 mg/dL (ref 6–20)
CALCIUM: 8.4 mg/dL — AB (ref 8.9–10.3)
CALCIUM: 8.9 mg/dL (ref 8.9–10.3)
CO2: 24 mmol/L (ref 22–32)
CO2: 24 mmol/L (ref 22–32)
CREATININE: 0.59 mg/dL (ref 0.44–1.00)
CREATININE: 0.65 mg/dL (ref 0.44–1.00)
Chloride: 106 mmol/L (ref 101–111)
Chloride: 111 mmol/L (ref 101–111)
GFR calc Af Amer: >60 mL/min (ref >60)
GFR calc non Af Amer: >60 mL/min (ref >60)
GLUCOSE: 118 mg/dL — AB (ref 65–99)
Glucose, Bld: 98 mg/dL (ref 65–99)
Potassium: 3.3 mmol/L — ABNORMAL LOW (ref 3.5–5.1)
Potassium: 3.5 mmol/L (ref 3.5–5.1)
Sodium: 139 mmol/L (ref 135–145)
Sodium: 140 mmol/L (ref 135–145)

## 2016-11-04 LAB — PREPARE FRESH FROZEN PLASMA
BLOOD PRODUCT EXPIRATION DATE: 201802252359
BLOOD PRODUCT EXPIRATION DATE: 201802252359
BLOOD PRODUCT EXPIRATION DATE: 201802252359
BLOOD PRODUCT EXPIRATION DATE: 201803012359
Blood Product Expiration Date: 201802252359
Blood Product Expiration Date: 201802252359
Blood Product Expiration Date: 201802252359
Blood Product Expiration Date: 201802252359
ISSUE DATE / TIME: 201802201957
ISSUE DATE / TIME: 201802201909
ISSUE DATE / TIME: 201802201909
ISSUE DATE / TIME: 201802201957
UNIT TYPE AND RH: 6200
UNIT TYPE AND RH: 6200
UNIT TYPE AND RH: 6200
UNIT TYPE AND RH: 6200
Unit Type and Rh: 6200
Unit Type and Rh: 6200
Unit Type and Rh: 6200
Unit Type and Rh: 6200

## 2016-11-04 LAB — PROTIME-INR
INR: 1.11
INR: 1.23
PROTHROMBIN TIME: 14.4 s (ref 11.4–15.2)
Prothrombin Time: 15.6 seconds — ABNORMAL HIGH (ref 11.4–15.2)

## 2016-11-04 LAB — CDS SEROLOGY

## 2016-11-04 LAB — POCT I-STAT 3, ART BLOOD GAS (G3+)
Acid-base deficit: 1 mmol/L (ref 0.0–2.0)
BICARBONATE: 24.4 mmol/L (ref 20.0–28.0)
O2 Saturation: 100 %
PCO2 ART: 43.7 mmHg (ref 32.0–48.0)
Patient temperature: 98.4
TCO2: 26 mmol/L (ref 0–100)
pH, Arterial: 7.354 (ref 7.350–7.450)
pO2, Arterial: 234 mmHg — ABNORMAL HIGH (ref 83.0–108.0)

## 2016-11-04 LAB — BLOOD PRODUCT ORDER (VERBAL) VERIFICATION

## 2016-11-04 LAB — TRIGLYCERIDES: Triglycerides: 63 mg/dL (ref <150)

## 2016-11-04 LAB — ABO/RH: ABO/RH(D): A POS

## 2016-11-04 LAB — LACTIC ACID, PLASMA: Lactic Acid, Venous: 1.2 mmol/L (ref 0.5–1.9)

## 2016-11-04 LAB — HIV ANTIBODY (ROUTINE TESTING W REFLEX): HIV SCREEN 4TH GENERATION: NONREACTIVE

## 2016-11-04 SURGERY — OPEN REDUCTION INTERNAL FIXATION (ORIF) TIBIAL PLATEAU
Anesthesia: General | Site: Leg Lower | Laterality: Right

## 2016-11-04 MED ORDER — FENTANYL CITRATE (PF) 100 MCG/2ML IJ SOLN
INTRAMUSCULAR | Status: AC
  Filled 2016-11-04: qty 2

## 2016-11-04 MED ORDER — ROCURONIUM BROMIDE 100 MG/10ML IV SOLN
INTRAVENOUS | Status: DC | PRN
  Administered 2016-11-04: 20 mg via INTRAVENOUS
  Administered 2016-11-04: 30 mg via INTRAVENOUS

## 2016-11-04 MED ORDER — 0.9 % SODIUM CHLORIDE (POUR BTL) OPTIME
TOPICAL | Status: DC | PRN
  Administered 2016-11-04 (×3): 1000 mL

## 2016-11-04 MED ORDER — PHENYLEPHRINE 40 MCG/ML (10ML) SYRINGE FOR IV PUSH (FOR BLOOD PRESSURE SUPPORT)
PREFILLED_SYRINGE | INTRAVENOUS | Status: AC
  Filled 2016-11-04: qty 10

## 2016-11-04 MED ORDER — MIDAZOLAM HCL 2 MG/2ML IJ SOLN
INTRAMUSCULAR | Status: DC | PRN
  Administered 2016-11-04 (×2): 2 mg via INTRAVENOUS

## 2016-11-04 MED ORDER — EPHEDRINE 5 MG/ML INJ
INTRAVENOUS | Status: AC
  Filled 2016-11-04: qty 10

## 2016-11-04 MED ORDER — PROPOFOL 10 MG/ML IV BOLUS
INTRAVENOUS | Status: AC
  Filled 2016-11-04: qty 20

## 2016-11-04 MED ORDER — SODIUM CHLORIDE 0.9 % IV SOLN
30.0000 meq | Freq: Once | INTRAVENOUS | Status: DC
  Filled 2016-11-04: qty 15

## 2016-11-04 MED ORDER — LIDOCAINE 2% (20 MG/ML) 5 ML SYRINGE
INTRAMUSCULAR | Status: AC
  Filled 2016-11-04: qty 5

## 2016-11-04 MED ORDER — FENTANYL BOLUS VIA INFUSION
50.0000 ug | INTRAVENOUS | Status: DC | PRN
  Administered 2016-11-04 (×2): 50 ug via INTRAVENOUS
  Administered 2016-11-04 (×3): 100 ug via INTRAVENOUS
  Filled 2016-11-04 (×6): qty 50

## 2016-11-04 MED ORDER — CEFAZOLIN SODIUM 1 G IJ SOLR
INTRAMUSCULAR | Status: DC | PRN
  Administered 2016-11-04: 2 g via INTRAMUSCULAR

## 2016-11-04 MED ORDER — CEFAZOLIN SODIUM 1 G IJ SOLR
INTRAMUSCULAR | Status: AC
  Filled 2016-11-04: qty 20

## 2016-11-04 MED ORDER — ONDANSETRON HCL 4 MG/2ML IJ SOLN
INTRAMUSCULAR | Status: AC
  Filled 2016-11-04: qty 2

## 2016-11-04 MED ORDER — LACTATED RINGERS IV SOLN
INTRAVENOUS | Status: DC | PRN
  Administered 2016-11-04: 12:00:00 via INTRAVENOUS

## 2016-11-04 MED ORDER — PHENYLEPHRINE HCL 10 MG/ML IJ SOLN
INTRAVENOUS | Status: DC | PRN
  Administered 2016-11-04: 30 ug/min via INTRAVENOUS

## 2016-11-04 MED ORDER — DEXMEDETOMIDINE HCL IN NACL 200 MCG/50ML IV SOLN
0.4000 ug/kg/h | INTRAVENOUS | Status: DC
Start: 2016-11-04 — End: 2016-11-06
  Administered 2016-11-04: 0.4 ug/kg/h via INTRAVENOUS
  Administered 2016-11-04: 0.6 ug/kg/h via INTRAVENOUS
  Filled 2016-11-04: qty 50

## 2016-11-04 MED ORDER — ROCURONIUM BROMIDE 50 MG/5ML IV SOSY
PREFILLED_SYRINGE | INTRAVENOUS | Status: AC
  Filled 2016-11-04: qty 10

## 2016-11-04 MED ORDER — CHLORHEXIDINE GLUCONATE CLOTH 2 % EX PADS
6.0000 | MEDICATED_PAD | Freq: Every morning | CUTANEOUS | Status: DC
  Administered 2016-11-04 – 2016-11-16 (×12): 6 via TOPICAL

## 2016-11-04 MED ORDER — BUPIVACAINE HCL (PF) 0.25 % IJ SOLN
INTRAMUSCULAR | Status: AC
  Filled 2016-11-04: qty 30

## 2016-11-04 MED ORDER — SODIUM CHLORIDE 0.9 % IV SOLN
30.0000 meq | Freq: Once | INTRAVENOUS | Status: AC
  Administered 2016-11-04: 30 meq via INTRAVENOUS
  Filled 2016-11-04: qty 15

## 2016-11-04 MED ORDER — SUGAMMADEX SODIUM 200 MG/2ML IV SOLN
INTRAVENOUS | Status: AC
  Filled 2016-11-04: qty 2

## 2016-11-04 MED ORDER — SODIUM CHLORIDE 0.9 % IV SOLN
25.0000 ug/h | INTRAVENOUS | Status: DC
  Administered 2016-11-04: 400 ug/h via INTRAVENOUS
  Administered 2016-11-04: 100 ug/h via INTRAVENOUS
  Administered 2016-11-04: 50 ug/h via INTRAVENOUS
  Administered 2016-11-05: 400 ug/h via INTRAVENOUS
  Filled 2016-11-04 (×4): qty 50

## 2016-11-04 MED ORDER — DEXAMETHASONE SODIUM PHOSPHATE 10 MG/ML IJ SOLN
INTRAMUSCULAR | Status: AC
  Filled 2016-11-04: qty 1

## 2016-11-04 MED ORDER — MIDAZOLAM HCL 2 MG/2ML IJ SOLN
INTRAMUSCULAR | Status: AC
  Filled 2016-11-04: qty 4

## 2016-11-04 SURGICAL SUPPLY — 73 items
BANDAGE ACE 6X5 VEL STRL LF (GAUZE/BANDAGES/DRESSINGS) ×3 IMPLANT
BANDAGE ESMARK 6X9 LF (GAUZE/BANDAGES/DRESSINGS) ×1 IMPLANT
BIT DRILL 2.7 QC 7.9IN LONG (BIT) ×2 IMPLANT
BIT DRILL QC 2.7 6.3IN  SHORT (BIT) ×4
BIT DRILL QC 2.7 6.3IN SHORT (BIT) IMPLANT
BLADE SURG ROTATE 9660 (MISCELLANEOUS) ×2 IMPLANT
BNDG CMPR 9X6 STRL LF SNTH (GAUZE/BANDAGES/DRESSINGS) ×1
BNDG COHESIVE 6X5 TAN STRL LF (GAUZE/BANDAGES/DRESSINGS) ×3 IMPLANT
BNDG ESMARK 6X9 LF (GAUZE/BANDAGES/DRESSINGS) ×3
COVER SURGICAL LIGHT HANDLE (MISCELLANEOUS) ×1 IMPLANT
CUFF TOURNIQUET SINGLE 24IN (TOURNIQUET CUFF) ×2 IMPLANT
DRAPE C-ARM 42X72 X-RAY (DRAPES) ×3 IMPLANT
DRAPE C-ARMOR (DRAPES) ×3 IMPLANT
DRAPE IMP U-DRAPE 54X76 (DRAPES) ×6 IMPLANT
DRAPE INCISE IOBAN 66X45 STRL (DRAPES) ×3 IMPLANT
DRAPE ORTHO SPLIT 77X108 STRL (DRAPES) ×6
DRAPE PROXIMA HALF (DRAPES) ×3 IMPLANT
DRAPE SURG ORHT 6 SPLT 77X108 (DRAPES) ×2 IMPLANT
DRAPE U-SHAPE 47X51 STRL (DRAPES) ×3 IMPLANT
DURAPREP 26ML APPLICATOR (WOUND CARE) ×3 IMPLANT
ELECT REM PT RETURN 9FT ADLT (ELECTROSURGICAL) ×3
ELECTRODE REM PT RTRN 9FT ADLT (ELECTROSURGICAL) ×1 IMPLANT
FACESHIELD STD STERILE (MASK) ×1 IMPLANT
GAUZE SPONGE 4X4 12PLY STRL (GAUZE/BANDAGES/DRESSINGS) ×2 IMPLANT
GAUZE XEROFORM 1X8 LF (GAUZE/BANDAGES/DRESSINGS) ×5 IMPLANT
GLOVE BIOGEL PI IND STRL 7.5 (GLOVE) IMPLANT
GLOVE BIOGEL PI INDICATOR 7.5 (GLOVE) ×2
GLOVE ECLIPSE 6.5 STRL STRAW (GLOVE) ×2 IMPLANT
GLOVE SKINSENSE NS SZ7.5 (GLOVE) ×2
GLOVE SKINSENSE STRL SZ7.5 (GLOVE) ×1 IMPLANT
GLOVE SURG SS PI 6.5 STRL IVOR (GLOVE) ×4 IMPLANT
GLOVE SURG SYN 7.5  E (GLOVE) ×2
GLOVE SURG SYN 7.5 E (GLOVE) ×1 IMPLANT
GLOVE SURG SYN 7.5 PF PI (GLOVE) ×1 IMPLANT
GOWN STRL REIN XL XLG (GOWN DISPOSABLE) ×7 IMPLANT
K-WIRE 2.0 (WIRE) ×4
K-WIRE TROCAR PT 2.0 150MM (WIRE) ×2
KIT BASIN OR (CUSTOM PROCEDURE TRAY) ×3 IMPLANT
KIT ROOM TURNOVER OR (KITS) ×3 IMPLANT
KWIRE TROCAR PT 2.0 150 (WIRE) IMPLANT
KWIRE TROCAR PT 2.0 150MM (WIRE) ×2 IMPLANT
MANIFOLD NEPTUNE II (INSTRUMENTS) ×3 IMPLANT
NDL SUT 6 .5 CRC .975X.05 MAYO (NEEDLE) IMPLANT
NEEDLE MAYO TAPER (NEEDLE)
NS IRRIG 1000ML POUR BTL (IV SOLUTION) ×7 IMPLANT
PACK GENERAL/GYN (CUSTOM PROCEDURE TRAY) ×3 IMPLANT
PAD ABD 8X10 STRL (GAUZE/BANDAGES/DRESSINGS) ×3 IMPLANT
PAD ARMBOARD 7.5X6 YLW CONV (MISCELLANEOUS) ×8 IMPLANT
PADDING CAST COTTON 6X4 STRL (CAST SUPPLIES) ×2 IMPLANT
PLATE PROX TIBIA 3.5X13 (Plate) ×2 IMPLANT
SCREW 3.5X24MM (Screw) ×2 IMPLANT
SCREW CORT T20 20X3.5XST NS LF (Screw) IMPLANT
SCREW CORTEX 3.5X32 (Screw) ×2 IMPLANT
SCREW CORTICAL 3.5X20 (Screw) ×6 IMPLANT
SCREW LOCK 3.5X30 (Screw) ×3 IMPLANT
SCREW LOCK 3.5X34 (Screw) ×3 IMPLANT
SCREW LOCK 3.5X50 (Screw) ×4 IMPLANT
SCREW LOCK 3.5X65 (Screw) ×6 IMPLANT
SCREW LOCK T20 30X3.5XST CORT (Screw) IMPLANT
SCREW LOCK T20 34X3.5XST CORT (Screw) IMPLANT
SCREW LOCK T20 65X3.5XST (Screw) IMPLANT
SCREW NONLOCK 3.5X36 (Screw) ×2 IMPLANT
SPONGE GAUZE 4X4 12PLY STER LF (GAUZE/BANDAGES/DRESSINGS) ×1 IMPLANT
STAPLER VISISTAT 35W (STAPLE) ×4 IMPLANT
SUT ETHILON 2 0 FS 18 (SUTURE) IMPLANT
SUT ETHILON 3 0 PS 1 (SUTURE) IMPLANT
SUT PDS AB 0 CT 36 (SUTURE) IMPLANT
SUT VIC AB 0 CT1 27 (SUTURE) ×9
SUT VIC AB 0 CT1 27XBRD ANBCTR (SUTURE) IMPLANT
SUT VIC AB 2-0 CT1 27 (SUTURE) ×18
SUT VIC AB 2-0 CT1 TAPERPNT 27 (SUTURE) ×1 IMPLANT
TOWEL OR 17X24 6PK STRL BLUE (TOWEL DISPOSABLE) ×3 IMPLANT
TOWEL OR 17X26 10 PK STRL BLUE (TOWEL DISPOSABLE) ×4 IMPLANT
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
## 2016-11-04 NOTE — Anesthesia Preprocedure Evaluation (Signed)
Anesthesia Evaluation  Patient identified by MRN, date of birth, ID band Patient awake    Reviewed: Allergy & Precautions, NPO status , Patient's Chart, lab work & pertinent test results  Airway Mallampati: I  TM Distance: >3 FB Neck ROM: Full    Dental   Pulmonary Current Smoker,    Pulmonary exam normal        Cardiovascular Normal cardiovascular exam     Neuro/Psych    GI/Hepatic   Endo/Other    Renal/GU      Musculoskeletal   Abdominal   Peds  Hematology   Anesthesia Other Findings   Reproductive/Obstetrics                             Anesthesia Physical Anesthesia Plan  ASA: II  Anesthesia Plan: General   Post-op Pain Management:    Induction: Intravenous  Airway Management Planned: Oral ETT  Additional Equipment:   Intra-op Plan:   Post-operative Plan: Extubation in OR  Informed Consent: I have reviewed the patients History and Physical, chart, labs and discussed the procedure including the risks, benefits and alternatives for the proposed anesthesia with the patient or authorized representative who has indicated his/her understanding and acceptance.     Plan Discussed with: CRNA and Surgeon  Anesthesia Plan Comments:         Anesthesia Quick Evaluation  

## 2016-11-04 NOTE — Anesthesia Postprocedure Evaluation (Signed)
Anesthesia Post Note  Patient: Marissa Bolton  Procedure(s) Performed: Procedure(s) (LRB): OPEN REDUCTION INTERNAL FIXATION (ORIF) TIBIAL PLATEAU (Right)  Patient location during evaluation: PACU Anesthesia Type: General Level of consciousness: awake and alert Pain management: pain level controlled Vital Signs Assessment: post-procedure vital signs reviewed and stable Respiratory status: spontaneous breathing, nonlabored ventilation, respiratory function stable and patient connected to nasal cannula oxygen Cardiovascular status: blood pressure returned to baseline and stable Postop Assessment: no signs of nausea or vomiting Anesthetic complications: no       Last Vitals:  Vitals:   11/04/16 1520 11/04/16 1600  BP: 102/72 116/85  Pulse: 81 74  Resp: (!) 23 16  Temp:      Last Pain:  Vitals:   11/04/16 0742  TempSrc: Axillary  PainSc:                  [name] DAVID

## 2016-11-04 NOTE — Transfer of Care (Signed)
Immediate Anesthesia Transfer of Care Note  Patient: Marissa Bolton  Procedure(s) Performed: Procedure(s) with comments: OPEN REDUCTION INTERNAL FIXATION (ORIF) TIBIAL PLATEAU (Right) - right  Patient Location: PACU  Anesthesia Type:General  Level of Consciousness: sedated, unresponsive and Patient remains intubated per anesthesia plan  Airway & Oxygen Therapy: Patient remains intubated per anesthesia plan and Patient placed on Ventilator (see vital sign flow sheet for setting)  Post-op Assessment: Report given to RN and Post -op Vital signs reviewed and stable  Post vital signs: Reviewed and stable  Last Vitals:  Vitals:   11/04/16 1141 11/04/16 1200  BP: 92/60 (!) 81/60  Pulse: (!) 102 98  Resp: 16 16  Temp:      Last Pain:  Vitals:   11/04/16 0742  TempSrc: Axillary  PainSc:          Complications: No apparent anesthesia complications
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
## 2016-11-05 ENCOUNTER — Inpatient Hospital Stay (HOSPITAL_COMMUNITY): Payer: Medicaid Other

## 2016-11-05 ENCOUNTER — Encounter (HOSPITAL_COMMUNITY): Payer: Self-pay | Admitting: Orthopaedic Surgery

## 2016-11-05 LAB — BASIC METABOLIC PANEL
ANION GAP: 8 (ref 5–15)
BUN: 5 mg/dL — ABNORMAL LOW (ref 6–20)
CALCIUM: 7.7 mg/dL — AB (ref 8.9–10.3)
CHLORIDE: 109 mmol/L (ref 101–111)
CO2: 24 mmol/L (ref 22–32)
Creatinine, Ser: 0.59 mg/dL (ref 0.44–1.00)
GFR calc non Af Amer: >60 mL/min (ref >60)
Glucose, Bld: 71 mg/dL (ref 65–99)
Potassium: 3.1 mmol/L — ABNORMAL LOW (ref 3.5–5.1)
Sodium: 141 mmol/L (ref 135–145)

## 2016-11-05 LAB — CBC
HEMATOCRIT: 34.1 % — AB (ref 36.0–46.0)
HEMOGLOBIN: 11.1 g/dL — AB (ref 12.0–15.0)
MCH: 27.5 pg (ref 26.0–34.0)
MCHC: 32.6 g/dL (ref 30.0–36.0)
MCV: 84.6 fL (ref 78.0–100.0)
Platelets: 154 10*3/uL (ref 150–400)
RBC: 4.03 MIL/uL (ref 3.87–5.11)
RDW: 16.9 % — ABNORMAL HIGH (ref 11.5–15.5)
WBC: 17.7 10*3/uL — AB (ref 4.0–10.5)

## 2016-11-05 MED ORDER — FENTANYL CITRATE (PF) 100 MCG/2ML IJ SOLN
50.0000 ug | INTRAMUSCULAR | Status: DC | PRN
  Administered 2016-11-05 – 2016-11-06 (×3): 100 ug via INTRAVENOUS
  Filled 2016-11-05 (×2): qty 2

## 2016-11-05 MED ORDER — DIPHENHYDRAMINE HCL 12.5 MG/5ML PO ELIX: 12.5000 mg | ORAL_SOLUTION | Freq: Four times a day (QID) | ORAL | Status: DC | PRN

## 2016-11-05 MED ORDER — DIPHENHYDRAMINE HCL 50 MG/ML IJ SOLN
12.5000 mg | Freq: Four times a day (QID) | INTRAMUSCULAR | Status: DC | PRN
  Administered 2016-11-08: 12.5 mg via INTRAVENOUS
  Filled 2016-11-05: qty 1

## 2016-11-05 MED ORDER — ONDANSETRON HCL 4 MG/2ML IJ SOLN: 4.0000 mg | Freq: Four times a day (QID) | INTRAMUSCULAR | Status: DC | PRN

## 2016-11-05 MED ORDER — NALOXONE HCL 0.4 MG/ML IJ SOLN: 0.4000 mg | INTRAMUSCULAR | Status: DC | PRN

## 2016-11-05 MED ORDER — SODIUM CHLORIDE 0.9 % IV SOLN
30.0000 meq | Freq: Once | INTRAVENOUS | Status: AC
  Administered 2016-11-05: 30 meq via INTRAVENOUS
  Filled 2016-11-05: qty 15

## 2016-11-05 MED ORDER — FENTANYL CITRATE (PF) 100 MCG/2ML IJ SOLN
INTRAMUSCULAR | Status: AC
  Administered 2016-11-05: 100 ug via INTRAVENOUS
  Filled 2016-11-05: qty 2

## 2016-11-05 MED ORDER — LORAZEPAM 2 MG/ML IJ SOLN
1.0000 mg | INTRAMUSCULAR | Status: DC | PRN
  Administered 2016-11-05 – 2016-11-11 (×7): 2 mg via INTRAVENOUS
  Administered 2016-11-12: 1 mg via INTRAVENOUS
  Administered 2016-11-12: 2 mg via INTRAVENOUS
  Administered 2016-11-13 – 2016-11-15 (×2): 1 mg via INTRAVENOUS
  Administered 2016-11-15: 2 mg via INTRAVENOUS
  Filled 2016-11-05 (×12): qty 1

## 2016-11-05 MED ORDER — FENTANYL 40 MCG/ML IV SOLN
INTRAVENOUS | Status: DC
  Administered 2016-11-05: 105 ug via INTRAVENOUS
  Administered 2016-11-05: 1000 ug via INTRAVENOUS
  Administered 2016-11-05: 165 ug via INTRAVENOUS
  Filled 2016-11-05 (×2): qty 25

## 2016-11-05 MED ORDER — SODIUM CHLORIDE 0.9% FLUSH: 9.0000 mL | INTRAVENOUS | Status: DC | PRN

## 2016-11-05 NOTE — Procedures (Signed)
Extubation Procedure Note  Patient Details:   Name: Marissa Bolton DOB: [birth year] MRN: [id number]   Airway Documentation:     Evaluation  O2 sats: stable throughout Complications: No apparent complications Patient did tolerate procedure well. Bilateral Breath Sounds: Clear, Diminished   Yes   Pt extubated to 4L [place] per MD order. Pt stable throughout with no complications. Pt whispering post extubation but is refusing or unwilling to cough. When asked to cough, she shakes her head no. MD and RN at bedside. RT will continue to monitor.   Carolan ShiverKelley, Garrison Michie M 11/05/2016, 9:39 AM

## 2016-11-05 NOTE — Progress Notes (Signed)
Wasted 36mL of Fentanyl, witnessed by Val EagleMeghan Bergman, RN.

## 2016-11-05 NOTE — Progress Notes (Signed)
   Subjective:  Patient reports pain as marked.  No events.  Objective:   VITALS:   Vitals:   11/05/16 1109 11/05/16 1200 11/05/16 1300 11/05/16 1400  BP:  127/63 121/74 126/79  Pulse:  (!) 117 (!) 121 (!) 101  Resp: 16 15 14 12   Temp:  99.9 F (37.7 C)    TempSrc:  Axillary    SpO2: 92% 96% 97% 100%  Weight:      Height:        Neurologically intact Neurovascular intact Sensation intact distally Intact pulses distally Dorsiflexion/Plantar flexion intact Incision: dressing C/D/I and no drainage No cellulitis present Compartment soft   Lab Results  Component Value Date   WBC 17.7 (H) 11/05/2016   HGB 11.1 (L) 11/05/2016   HCT 34.1 (L) 11/05/2016   MCV 84.6 11/05/2016   PLT 154 11/05/2016     Assessment/Plan:  1 Day Post-Op   - Expected postop acute blood loss anemia - will monitor for symptoms - Up with PT/OT - DVT ppx - SCDs, ambulation, lovenox preferred - NWB operative extremity x 6 weeks, unlocked bledsoe brace    Glee ArvinMichael Waylan Busta 11/05/2016, 2:17 PM [phone]

## 2016-11-05 NOTE — Progress Notes (Signed)
[facility] Progress Note Patient Details:  Marissa CowmanBobbie Jo Bolton 1986-01-21 [id number]  Patient ID: Marissa CowmanBobbie Jo Bolton, female   DOB: 1986-01-21, 31 y.o.   MRN: [id number]   Saul FordyceJennifer C Roza Creamer 11/05/2016, 8:45 AMCalled [employer] for Bledsoe brace unlocked.
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
## 2016-11-06 LAB — CBC
HCT: 31.9 % — ABNORMAL LOW (ref 36.0–46.0)
Hemoglobin: 10.3 g/dL — ABNORMAL LOW (ref 12.0–15.0)
MCH: 27.5 pg (ref 26.0–34.0)
MCHC: 32.3 g/dL (ref 30.0–36.0)
MCV: 85.1 fL (ref 78.0–100.0)
PLATELETS: 215 10*3/uL (ref 150–400)
RBC: 3.75 MIL/uL — ABNORMAL LOW (ref 3.87–5.11)
RDW: 16.7 % — AB (ref 11.5–15.5)
WBC: 24.8 10*3/uL — ABNORMAL HIGH (ref 4.0–10.5)

## 2016-11-06 LAB — BASIC METABOLIC PANEL
Anion gap: 7 (ref 5–15)
CO2: 26 mmol/L (ref 22–32)
CREATININE: 0.59 mg/dL (ref 0.44–1.00)
Calcium: 8 mg/dL — ABNORMAL LOW (ref 8.9–10.3)
Chloride: 104 mmol/L (ref 101–111)
GFR calc Af Amer: >60 mL/min (ref >60)
GLUCOSE: 106 mg/dL — AB (ref 65–99)
POTASSIUM: 3.2 mmol/L — AB (ref 3.5–5.1)
Sodium: 137 mmol/L (ref 135–145)

## 2016-11-06 MED ORDER — HYDROMORPHONE HCL 2 MG/ML IJ SOLN
0.5000 mg | INTRAMUSCULAR | Status: DC | PRN
Start: 2016-11-06 — End: 2016-11-15
  Administered 2016-11-06: 1 mg via INTRAVENOUS
  Administered 2016-11-06 – 2016-11-10 (×13): 2 mg via INTRAVENOUS
  Administered 2016-11-10: 1 mg via INTRAVENOUS
  Administered 2016-11-11 – 2016-11-13 (×20): 2 mg via INTRAVENOUS
  Administered 2016-11-14: 1 mg via INTRAVENOUS
  Administered 2016-11-15 (×2): 2 mg via INTRAVENOUS
  Filled 2016-11-06 (×39): qty 1

## 2016-11-06 MED ORDER — NICOTINE 14 MG/24HR TD PT24
14.0000 mg | MEDICATED_PATCH | Freq: Every day | TRANSDERMAL | Status: DC
  Administered 2016-11-06 – 2016-11-16 (×11): 14 mg via TRANSDERMAL
  Filled 2016-11-06 (×11): qty 1

## 2016-11-06 MED ORDER — ALPRAZOLAM 0.25 MG PO TABS
0.2500 mg | ORAL_TABLET | Freq: Three times a day (TID) | ORAL | Status: DC | PRN
  Administered 2016-11-06 – 2016-11-15 (×12): 0.25 mg via ORAL
  Filled 2016-11-06 (×12): qty 1

## 2016-11-06 MED ORDER — OXYCODONE HCL 5 MG PO TABS
5.0000 mg | ORAL_TABLET | ORAL | Status: DC | PRN
  Administered 2016-11-06 – 2016-11-12 (×19): 15 mg via ORAL
  Administered 2016-11-12: 5 mg via ORAL
  Administered 2016-11-12 – 2016-11-15 (×10): 15 mg via ORAL
  Filled 2016-11-06 (×32): qty 3

## 2016-11-06 MED ORDER — ENOXAPARIN SODIUM 40 MG/0.4ML ~~LOC~~ SOLN
40.0000 mg | SUBCUTANEOUS | Status: DC
  Administered 2016-11-06 – 2016-11-07 (×2): 40 mg via SUBCUTANEOUS
  Filled 2016-11-06 (×2): qty 0.4

## 2016-11-06 MED ORDER — ACETAMINOPHEN 325 MG PO TABS
650.0000 mg | ORAL_TABLET | Freq: Four times a day (QID) | ORAL | Status: DC | PRN
  Administered 2016-11-06 – 2016-11-08 (×3): 650 mg via ORAL
  Filled 2016-11-06 (×3): qty 2

## 2016-11-06 MED ORDER — HYDROMORPHONE HCL 1 MG/ML IJ SOLN
INTRAMUSCULAR | Status: AC
  Filled 2016-11-06: qty 2

## 2016-11-06 MED ORDER — SODIUM CHLORIDE 0.9 % IV SOLN
30.0000 meq | Freq: Once | INTRAVENOUS | Status: AC
  Administered 2016-11-06: 30 meq via INTRAVENOUS
  Filled 2016-11-06: qty 15

## 2016-11-06 MED ORDER — HYDROMORPHONE HCL 1 MG/ML IJ SOLN
0.5000 mg | INTRAMUSCULAR | Status: DC | PRN
  Administered 2016-11-06 (×2): 1 mg via INTRAVENOUS
  Administered 2016-11-06: 2 mg via INTRAVENOUS
  Administered 2016-11-06 (×4): 1 mg via INTRAVENOUS
  Administered 2016-11-06: 2 mg via INTRAVENOUS
  Filled 2016-11-06: qty 2
  Filled 2016-11-06 (×5): qty 1
  Filled 2016-11-06: qty 2
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
## 2016-11-06 NOTE — Progress Notes (Signed)
Pt transferred to room 6N 20, bedsidehandoff given  to GardendaleZee, Charity fundraiserN. Husband and belongings at bedside.

## 2016-11-06 NOTE — Progress Notes (Signed)
Rehab Admissions Coordinator Note:  Patient was screened by Clois DupesBoyette, Lysa Livengood Godwin for appropriateness for an Inpatient Acute Rehab Consult per OT recommendation.  At this time, we are recommending await further progress with mobility over the weekend before determining rehab venue options. . I will follow.  Clois DupesBoyette, Daire Okimoto Godwin 11/06/2016, 1:14 PM  I can be reached at [phone].

## 2016-11-06 NOTE — Progress Notes (Signed)
   Subjective:  Patient reports pain as severe when leg is moved  Objective:   VITALS:   Vitals:   11/06/16 1100 11/06/16 1200 11/06/16 1300 11/06/16 1508  BP: (!) 130/91 120/83 121/82 130/73  Pulse: (!) 111 (!) 113 (!) 116 (!) 125  Resp: 20 (!) 21 19   Temp:  (!) 101 F (38.3 C)  (!) 102.3 F (39.1 C)  TempSrc:  Oral  Oral  SpO2: 98% 92% 96% 96%  Weight:      Height:        Neurologically intact Neurovascular intact Sensation intact distally Intact pulses distally Dorsiflexion/Plantar flexion intact Incision: dressing C/D/I and no drainage No cellulitis present Compartment soft   Lab Results  Component Value Date   WBC 24.8 (H) 11/06/2016   HGB 10.3 (L) 11/06/2016   HCT 31.9 (L) 11/06/2016   MCV 85.1 11/06/2016   PLT 215 11/06/2016     Assessment/Plan:  2 Days Post-Op   - dressings changed - NWB x 6 weeks - lovenox for at least 2 weeks - f/u in office in 2 weeks   Glee ArvinMichael Izzac Rockett 11/06/2016, 3:31 PM [phone]

## 2016-11-06 NOTE — Progress Notes (Signed)
15 ml of Fentanyl wasted from PCA syringe, witnessed by Lily Peerhomas Lilley, RN
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
## 2016-11-06 NOTE — Progress Notes (Signed)
CSW attempted Patient at bedside to conduct assessment/SBIRT. Patient sedated and unable to participate in assessment. CSW to attempt again when Patient more alert and able to participate.    Enos FlingAshley Isay Perleberg, MSW, LCSW [facility]M Clinical Social Worker [phone]

## 2016-11-07 ENCOUNTER — Encounter (HOSPITAL_COMMUNITY): Payer: Self-pay

## 2016-11-07 ENCOUNTER — Inpatient Hospital Stay (HOSPITAL_COMMUNITY): Payer: Medicaid Other

## 2016-11-07 LAB — BASIC METABOLIC PANEL
Anion gap: 7 (ref 5–15)
BUN: <5 mg/dL — ABNORMAL LOW (ref 6–20)
CALCIUM: 8 mg/dL — AB (ref 8.9–10.3)
CO2: 30 mmol/L (ref 22–32)
CREATININE: 0.46 mg/dL (ref 0.44–1.00)
Chloride: 102 mmol/L (ref 101–111)
GFR calc Af Amer: >60 mL/min (ref >60)
GFR calc non Af Amer: >60 mL/min (ref >60)
GLUCOSE: 111 mg/dL — AB (ref 65–99)
Potassium: 2.8 mmol/L — ABNORMAL LOW (ref 3.5–5.1)
Sodium: 139 mmol/L (ref 135–145)

## 2016-11-07 LAB — CBC
HEMATOCRIT: 31.1 % — AB (ref 36.0–46.0)
Hemoglobin: 10.4 g/dL — ABNORMAL LOW (ref 12.0–15.0)
MCH: 28.4 pg (ref 26.0–34.0)
MCHC: 33.4 g/dL (ref 30.0–36.0)
MCV: 85 fL (ref 78.0–100.0)
Platelets: 309 10*3/uL (ref 150–400)
RBC: 3.66 MIL/uL — AB (ref 3.87–5.11)
RDW: 16.6 % — ABNORMAL HIGH (ref 11.5–15.5)
WBC: 18 10*3/uL — AB (ref 4.0–10.5)

## 2016-11-07 MED ORDER — SODIUM CHLORIDE 0.9 % IV SOLN
30.0000 meq | Freq: Once | INTRAVENOUS | Status: AC
  Administered 2016-11-07: 30 meq via INTRAVENOUS
  Filled 2016-11-07: qty 15

## 2016-11-07 MED ORDER — SODIUM CHLORIDE 0.9 % IV SOLN
30.0000 meq | Freq: Once | INTRAVENOUS | Status: AC
Start: 2016-11-07 — End: 2016-11-07

## 2016-11-07 MED ORDER — SODIUM CHLORIDE 0.9% FLUSH
10.0000 mL | INTRAVENOUS | Status: DC | PRN
  Administered 2016-11-08 – 2016-11-15 (×6): 10 mL
  Filled 2016-11-07 (×6): qty 40

## 2016-11-07 MED ORDER — SODIUM CHLORIDE 0.9% FLUSH
10.0000 mL | Freq: Two times a day (BID) | INTRAVENOUS | Status: DC
  Administered 2016-11-07 – 2016-11-09 (×3): 10 mL

## 2016-11-07 NOTE — Progress Notes (Signed)
Trauma Service Note  Subjective: Patient not out of bed yet.  PAS hose not being used.  Objective: Vital signs in last 24 hours: Temp:  [99.3 F (37.4 C)-102.3 F (39.1 C)] 99.3 F (37.4 C) (02/24 1002) Pulse Rate:  [106-125] 110 (02/24 1002) Resp:  [18-21] 20 (02/24 0128) BP: (111-131)/(57-83) 111/57 (02/24 1002) SpO2:  [92 %-100 %] 97 % (02/24 1002) Last BM Date: 11/02/16  Intake/Output from previous day: 02/23 0701 - 02/24 0700 In: 1930 [P.O.:510; I.V.:1155; IV Piggyback:265] Out: 1250 [Urine:1250] Intake/Output this shift: Total I/O In: 50 [P.O.:50] Out: 250 [Urine:250]  General: No distress  Lungs: Clear  Abd: Soft, tender, good bowel sounds.  Dressing is dry.  Extremities: No changes  Neuro: Intact  Lab Results: CBC   Recent Labs  11/06/16 0500 11/07/16 0440  WBC 24.8* 18.0*  HGB 10.3* 10.4*  HCT 31.9* 31.1*  PLT 215 309   BMET  Recent Labs  11/06/16 0500 11/07/16 0440  NA 137 139  K 3.2* 2.8*  CL 104 102  CO2 26 30  GLUCOSE 106* 111*  BUN <5* <5*  CREATININE 0.59 0.46  CALCIUM 8.0* 8.0*   PT/INR No results for input(s): LABPROT, INR in the last 72 hours. ABG No results for input(s): PHART, HCO3 in the last 72 hours.  Invalid input(s): PCO2, PO2  Studies/Results: No results found.  Anti-infectives: Anti-infectives    None      Assessment/Plan: s/p Procedure(s): OPEN REDUCTION INTERNAL FIXATION (ORIF) TIBIAL PLATEAU Advance diet Physical therapy  Left knee pain and swelling.  Will X-ray  LOS: 4 days   Marta LamasJames O. Gae BonWyatt, III, MD, FACS [phone] Trauma Surgeon 11/07/2016
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
## 2016-11-07 NOTE — Progress Notes (Signed)
Pt having hematuria 2x whenever she urinates, on call Dr. Carman Chingoug Blackman informed and ordered to hold the lovenox.

## 2016-11-08 ENCOUNTER — Inpatient Hospital Stay (HOSPITAL_COMMUNITY): Payer: Medicaid Other

## 2016-11-08 LAB — BASIC METABOLIC PANEL
ANION GAP: 8 (ref 5–15)
BUN: <5 mg/dL — ABNORMAL LOW (ref 6–20)
CO2: 30 mmol/L (ref 22–32)
Calcium: 7.8 mg/dL — ABNORMAL LOW (ref 8.9–10.3)
Chloride: 102 mmol/L (ref 101–111)
Creatinine, Ser: 0.47 mg/dL (ref 0.44–1.00)
Glucose, Bld: 91 mg/dL (ref 65–99)
POTASSIUM: 2.8 mmol/L — AB (ref 3.5–5.1)
SODIUM: 140 mmol/L (ref 135–145)

## 2016-11-08 LAB — URINALYSIS, ROUTINE W REFLEX MICROSCOPIC
BILIRUBIN URINE: NEGATIVE
GLUCOSE, UA: NEGATIVE mg/dL
Hgb urine dipstick: NEGATIVE
KETONES UR: NEGATIVE mg/dL
Nitrite: NEGATIVE
PH: 8 (ref 5.0–8.0)
Protein, ur: NEGATIVE mg/dL
SPECIFIC GRAVITY, URINE: 1.008 (ref 1.005–1.030)

## 2016-11-08 LAB — CBC
HCT: 28.9 % — ABNORMAL LOW (ref 36.0–46.0)
Hemoglobin: 9.4 g/dL — ABNORMAL LOW (ref 12.0–15.0)
MCH: 27.4 pg (ref 26.0–34.0)
MCHC: 32.5 g/dL (ref 30.0–36.0)
MCV: 84.3 fL (ref 78.0–100.0)
PLATELETS: 408 10*3/uL — AB (ref 150–400)
RBC: 3.43 MIL/uL — AB (ref 3.87–5.11)
RDW: 16.2 % — ABNORMAL HIGH (ref 11.5–15.5)
WBC: 11.8 10*3/uL — AB (ref 4.0–10.5)

## 2016-11-08 LAB — CBC WITH DIFFERENTIAL/PLATELET
Basophils Absolute: 0 10*3/uL (ref 0.0–0.1)
Basophils Relative: 0 %
EOS ABS: 0.5 10*3/uL (ref 0.0–0.7)
Eosinophils Relative: 5 %
HEMATOCRIT: 30.4 % — AB (ref 36.0–46.0)
Hemoglobin: 9.8 g/dL — ABNORMAL LOW (ref 12.0–15.0)
LYMPHS ABS: 2.5 10*3/uL (ref 0.7–4.0)
Lymphocytes Relative: 21 %
MCH: 27.5 pg (ref 26.0–34.0)
MCHC: 32.2 g/dL (ref 30.0–36.0)
MCV: 85.2 fL (ref 78.0–100.0)
MONO ABS: 1.5 10*3/uL — AB (ref 0.1–1.0)
MONOS PCT: 13 %
NEUTROS ABS: 7 10*3/uL (ref 1.7–7.7)
Neutrophils Relative %: 61 %
Platelets: 444 10*3/uL — ABNORMAL HIGH (ref 150–400)
RBC: 3.57 MIL/uL — ABNORMAL LOW (ref 3.87–5.11)
RDW: 16.3 % — AB (ref 11.5–15.5)
WBC: 11.6 10*3/uL — ABNORMAL HIGH (ref 4.0–10.5)

## 2016-11-08 MED ORDER — SODIUM CHLORIDE 0.9 % IV SOLN
30.0000 meq | Freq: Once | INTRAVENOUS | Status: AC
  Administered 2016-11-08: 30 meq via INTRAVENOUS
  Filled 2016-11-08: qty 15

## 2016-11-08 MED ORDER — POTASSIUM CHLORIDE CRYS ER 20 MEQ PO TBCR
20.0000 meq | EXTENDED_RELEASE_TABLET | Freq: Two times a day (BID) | ORAL | Status: DC
  Filled 2016-11-08: qty 1
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
## 2016-11-09 ENCOUNTER — Inpatient Hospital Stay (HOSPITAL_COMMUNITY): Payer: Medicaid Other

## 2016-11-09 LAB — BASIC METABOLIC PANEL
ANION GAP: 9 (ref 5–15)
BUN: 8 mg/dL (ref 6–20)
CHLORIDE: 103 mmol/L (ref 101–111)
CO2: 27 mmol/L (ref 22–32)
Calcium: 8.1 mg/dL — ABNORMAL LOW (ref 8.9–10.3)
Creatinine, Ser: 0.54 mg/dL (ref 0.44–1.00)
GFR calc Af Amer: >60 mL/min (ref >60)
GFR calc non Af Amer: >60 mL/min (ref >60)
Glucose, Bld: 95 mg/dL (ref 65–99)
POTASSIUM: 2.8 mmol/L — AB (ref 3.5–5.1)
SODIUM: 139 mmol/L (ref 135–145)

## 2016-11-09 LAB — CBC
HEMATOCRIT: 31.4 % — AB (ref 36.0–46.0)
Hemoglobin: 10.4 g/dL — ABNORMAL LOW (ref 12.0–15.0)
MCH: 27.9 pg (ref 26.0–34.0)
MCHC: 33.1 g/dL (ref 30.0–36.0)
MCV: 84.2 fL (ref 78.0–100.0)
Platelets: 550 10*3/uL — ABNORMAL HIGH (ref 150–400)
RBC: 3.73 MIL/uL — ABNORMAL LOW (ref 3.87–5.11)
RDW: 16.1 % — AB (ref 11.5–15.5)
WBC: 11.9 10*3/uL — AB (ref 4.0–10.5)

## 2016-11-09 LAB — EXPECTORATED SPUTUM ASSESSMENT W GRAM STAIN, RFLX TO RESP C

## 2016-11-09 LAB — HCG, SERUM, QUALITATIVE: Preg, Serum: NEGATIVE

## 2016-11-09 LAB — EXPECTORATED SPUTUM ASSESSMENT W REFEX TO RESP CULTURE

## 2016-11-09 MED ORDER — POTASSIUM CHLORIDE CRYS ER 20 MEQ PO TBCR
40.0000 meq | EXTENDED_RELEASE_TABLET | Freq: Two times a day (BID) | ORAL | Status: DC
  Administered 2016-11-09 (×2): 40 meq via ORAL
  Filled 2016-11-09 (×2): qty 2

## 2016-11-09 MED ORDER — ENOXAPARIN SODIUM 40 MG/0.4ML ~~LOC~~ SOLN
40.0000 mg | SUBCUTANEOUS | Status: AC
Start: 2016-11-09 — End: 2016-11-10
  Administered 2016-11-09 – 2016-11-10 (×2): 40 mg via SUBCUTANEOUS
  Filled 2016-11-09 (×2): qty 0.4

## 2016-11-09 MED ORDER — KCL IN DEXTROSE-NACL 20-5-0.45 MEQ/L-%-% IV SOLN
INTRAVENOUS | Status: DC
  Administered 2016-11-09: 09:00:00 via INTRAVENOUS
  Administered 2016-11-10: 1 mL via INTRAVENOUS
  Administered 2016-11-10 – 2016-11-15 (×9): via INTRAVENOUS
  Filled 2016-11-09 (×11): qty 1000

## 2016-11-09 MED ORDER — SODIUM CHLORIDE 0.9 % IV SOLN
30.0000 meq | Freq: Once | INTRAVENOUS | Status: AC
  Administered 2016-11-09: 30 meq via INTRAVENOUS
  Filled 2016-11-09: qty 15
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
## 2016-11-10 LAB — CBC
HCT: 32.7 % — ABNORMAL LOW (ref 36.0–46.0)
Hemoglobin: 10.7 g/dL — ABNORMAL LOW (ref 12.0–15.0)
MCH: 27.7 pg (ref 26.0–34.0)
MCHC: 32.7 g/dL (ref 30.0–36.0)
MCV: 84.7 fL (ref 78.0–100.0)
Platelets: 671 10*3/uL — ABNORMAL HIGH (ref 150–400)
RBC: 3.86 MIL/uL — AB (ref 3.87–5.11)
RDW: 16.8 % — ABNORMAL HIGH (ref 11.5–15.5)
WBC: 10.8 10*3/uL — AB (ref 4.0–10.5)

## 2016-11-10 MED ORDER — POTASSIUM CHLORIDE CRYS ER 20 MEQ PO TBCR
20.0000 meq | EXTENDED_RELEASE_TABLET | Freq: Two times a day (BID) | ORAL | Status: DC
  Administered 2016-11-10 – 2016-11-16 (×13): 20 meq via ORAL
  Filled 2016-11-10 (×13): qty 1

## 2016-11-10 MED ORDER — TRAMADOL HCL 50 MG PO TABS
50.0000 mg | ORAL_TABLET | Freq: Four times a day (QID) | ORAL | Status: DC | PRN
  Administered 2016-11-10 (×2): 50 mg via ORAL
  Filled 2016-11-10 (×2): qty 1

## 2016-11-10 MED ORDER — ACETAMINOPHEN 325 MG PO TABS
650.0000 mg | ORAL_TABLET | Freq: Four times a day (QID) | ORAL | Status: DC
Start: 2016-11-11 — End: 2016-11-16
  Administered 2016-11-11 – 2016-11-16 (×17): 650 mg via ORAL
  Filled 2016-11-10 (×19): qty 2

## 2016-11-10 MED ORDER — ACETAMINOPHEN 325 MG PO TABS
650.0000 mg | ORAL_TABLET | Freq: Four times a day (QID) | ORAL | Status: DC
  Administered 2016-11-10 (×2): 650 mg via ORAL
  Filled 2016-11-10 (×2): qty 2

## 2016-11-10 MED ORDER — TAMSULOSIN HCL 0.4 MG PO CAPS
0.4000 mg | ORAL_CAPSULE | Freq: Every day | ORAL | Status: DC
  Administered 2016-11-10 – 2016-11-16 (×7): 0.4 mg via ORAL
  Filled 2016-11-10 (×7): qty 1

## 2016-11-10 MED ORDER — POLYETHYLENE GLYCOL 3350 17 G PO PACK
17.0000 g | PACK | Freq: Two times a day (BID) | ORAL | Status: DC
  Administered 2016-11-10 – 2016-11-15 (×9): 17 g via ORAL
  Filled 2016-11-10 (×12): qty 1

## 2016-11-10 NOTE — Progress Notes (Signed)
Pt urinated 250 ml and she said she feel like I want to pee more but I can't, I did bladder scan shows 283 ml informed on call MD, Dr. Phylliss Blakeshelsea Connor and ordered in and out cath, I repositioned the pt  tried to insert the straight cath but there's obstruction, the pt said she will try to urinate on her own but for now I want to sleep, again on call MD made aware.

## 2016-11-10 NOTE — Progress Notes (Signed)
Pt is voiding adequately, did not c/o difficulty urination.
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
## 2016-11-10 NOTE — Progress Notes (Signed)
Chaplain [facility]. Provided a Book of Meditations for the patient to read to help with emotional support while inpatient in the hospital. Angelena Formhaplain Nickalas Mccarrick [phone]

## 2016-11-11 ENCOUNTER — Encounter (HOSPITAL_COMMUNITY): Payer: Self-pay | Admitting: Certified Registered"

## 2016-11-11 ENCOUNTER — Inpatient Hospital Stay (HOSPITAL_COMMUNITY): Payer: Medicaid Other

## 2016-11-11 ENCOUNTER — Inpatient Hospital Stay (HOSPITAL_COMMUNITY): Payer: Medicaid Other | Admitting: Certified Registered"

## 2016-11-11 ENCOUNTER — Encounter (HOSPITAL_COMMUNITY): Admission: EM | Disposition: A | Discharge status: Home / Self Care | Payer: Self-pay | Source: Home / Self Care

## 2016-11-11 DIAGNOSIS — S82141A Displaced bicondylar fracture of right tibia, initial encounter for closed fracture: Secondary | ICD-10-CM

## 2016-11-11 HISTORY — PX: ORIF TIBIA PLATEAU: SHX2132

## 2016-11-11 LAB — CBC
HEMATOCRIT: 35.6 % — AB (ref 36.0–46.0)
Hemoglobin: 11.4 g/dL — ABNORMAL LOW (ref 12.0–15.0)
MCH: 27.5 pg (ref 26.0–34.0)
MCHC: 32 g/dL (ref 30.0–36.0)
MCV: 85.8 fL (ref 78.0–100.0)
Platelets: 746 10*3/uL — ABNORMAL HIGH (ref 150–400)
RBC: 4.15 MIL/uL (ref 3.87–5.11)
RDW: 17.2 % — ABNORMAL HIGH (ref 11.5–15.5)
WBC: 12.8 10*3/uL — ABNORMAL HIGH (ref 4.0–10.5)

## 2016-11-11 LAB — WET PREP, GENITAL
SPERM: NONE SEEN
TRICH WET PREP: NONE SEEN
YEAST WET PREP: NONE SEEN

## 2016-11-11 SURGERY — OPEN REDUCTION INTERNAL FIXATION (ORIF) TIBIAL PLATEAU
Anesthesia: General | Site: Leg Lower | Laterality: Left

## 2016-11-11 MED ORDER — FENTANYL CITRATE (PF) 100 MCG/2ML IJ SOLN
INTRAMUSCULAR | Status: AC
  Filled 2016-11-11: qty 4

## 2016-11-11 MED ORDER — BUPIVACAINE HCL (PF) 0.25 % IJ SOLN
INTRAMUSCULAR | Status: AC
  Filled 2016-11-11: qty 30

## 2016-11-11 MED ORDER — MIDAZOLAM HCL 2 MG/2ML IJ SOLN
INTRAMUSCULAR | Status: AC
  Filled 2016-11-11: qty 2

## 2016-11-11 MED ORDER — HYDROMORPHONE HCL 1 MG/ML IJ SOLN
0.2500 mg | INTRAMUSCULAR | Status: DC | PRN
  Administered 2016-11-11 (×4): 0.5 mg via INTRAVENOUS

## 2016-11-11 MED ORDER — ACETAMINOPHEN 10 MG/ML IV SOLN
INTRAVENOUS | Status: AC
  Filled 2016-11-11: qty 100

## 2016-11-11 MED ORDER — KETAMINE HCL 10 MG/ML IJ SOLN
INTRAMUSCULAR | Status: DC | PRN
  Administered 2016-11-11: 30 mg via INTRAVENOUS

## 2016-11-11 MED ORDER — ROCURONIUM BROMIDE 50 MG/5ML IV SOSY
PREFILLED_SYRINGE | INTRAVENOUS | Status: AC
  Filled 2016-11-11: qty 5

## 2016-11-11 MED ORDER — FENTANYL CITRATE (PF) 100 MCG/2ML IJ SOLN
INTRAMUSCULAR | Status: AC
  Filled 2016-11-11: qty 2

## 2016-11-11 MED ORDER — CEFAZOLIN SODIUM-DEXTROSE 2-4 GM/100ML-% IV SOLN
2.0000 g | Freq: Once | INTRAVENOUS | Status: AC
  Administered 2016-11-11: 2 g via INTRAVENOUS
  Filled 2016-11-11: qty 100

## 2016-11-11 MED ORDER — PROPOFOL 10 MG/ML IV BOLUS
INTRAVENOUS | Status: DC | PRN
  Administered 2016-11-11: 150 mg via INTRAVENOUS

## 2016-11-11 MED ORDER — HYDROMORPHONE HCL 1 MG/ML IJ SOLN
INTRAMUSCULAR | Status: AC
  Filled 2016-11-11: qty 1

## 2016-11-11 MED ORDER — KETAMINE HCL-SODIUM CHLORIDE 100-0.9 MG/10ML-% IV SOSY
PREFILLED_SYRINGE | INTRAVENOUS | Status: AC
  Filled 2016-11-11: qty 10

## 2016-11-11 MED ORDER — MIDAZOLAM HCL 2 MG/2ML IJ SOLN
INTRAMUSCULAR | Status: DC | PRN
  Administered 2016-11-11: 2 mg via INTRAVENOUS

## 2016-11-11 MED ORDER — LIDOCAINE 2% (20 MG/ML) 5 ML SYRINGE
INTRAMUSCULAR | Status: AC
  Filled 2016-11-11: qty 5

## 2016-11-11 MED ORDER — ACETAMINOPHEN 10 MG/ML IV SOLN
INTRAVENOUS | Status: DC | PRN
  Administered 2016-11-11: 1000 mg via INTRAVENOUS

## 2016-11-11 MED ORDER — 0.9 % SODIUM CHLORIDE (POUR BTL) OPTIME
TOPICAL | Status: DC | PRN
  Administered 2016-11-11 (×3): 1000 mL

## 2016-11-11 MED ORDER — LACTATED RINGERS IV SOLN
INTRAVENOUS | Status: DC
  Administered 2016-11-11 (×2): via INTRAVENOUS

## 2016-11-11 MED ORDER — TRAMADOL HCL 50 MG PO TABS
50.0000 mg | ORAL_TABLET | Freq: Four times a day (QID) | ORAL | Status: DC
  Administered 2016-11-11 – 2016-11-13 (×6): 50 mg via ORAL
  Filled 2016-11-11 (×7): qty 1

## 2016-11-11 MED ORDER — SUCCINYLCHOLINE CHLORIDE 20 MG/ML IJ SOLN
INTRAMUSCULAR | Status: DC | PRN
  Administered 2016-11-11: 100 mg via INTRAVENOUS

## 2016-11-11 MED ORDER — METRONIDAZOLE 500 MG PO TABS
500.0000 mg | ORAL_TABLET | Freq: Two times a day (BID) | ORAL | Status: DC
  Administered 2016-11-11 – 2016-11-16 (×11): 500 mg via ORAL
  Filled 2016-11-11 (×11): qty 1

## 2016-11-11 MED ORDER — PROPOFOL 10 MG/ML IV BOLUS
INTRAVENOUS | Status: AC
  Filled 2016-11-11: qty 20

## 2016-11-11 MED ORDER — FENTANYL CITRATE (PF) 100 MCG/2ML IJ SOLN
INTRAMUSCULAR | Status: DC | PRN
  Administered 2016-11-11 (×2): 100 ug via INTRAVENOUS
  Administered 2016-11-11 (×2): 50 ug via INTRAVENOUS
  Administered 2016-11-11: 100 ug via INTRAVENOUS

## 2016-11-11 SURGICAL SUPPLY — 71 items
BANDAGE ACE 6X5 VEL STRL LF (GAUZE/BANDAGES/DRESSINGS) ×2 IMPLANT
BANDAGE ELASTIC 6 VELCRO ST LF (GAUZE/BANDAGES/DRESSINGS) ×1 IMPLANT
BANDAGE ESMARK 6X9 LF (GAUZE/BANDAGES/DRESSINGS) ×1 IMPLANT
BIT DRILL 2.5X2.75 QC CALB (BIT) ×1 IMPLANT
BLADE CLIPPER SURG (BLADE) IMPLANT
BNDG CMPR 9X6 STRL LF SNTH (GAUZE/BANDAGES/DRESSINGS) ×1
BNDG COHESIVE 6X5 TAN STRL LF (GAUZE/BANDAGES/DRESSINGS) ×2 IMPLANT
BNDG ESMARK 6X9 LF (GAUZE/BANDAGES/DRESSINGS) ×2
BONE CANC CHIPS 20CC PCAN1/4 (Bone Implant) ×2 IMPLANT
CHIPS CANC BONE 20CC PCAN1/4 (Bone Implant) ×1 IMPLANT
COVER SURGICAL LIGHT HANDLE (MISCELLANEOUS) ×2 IMPLANT
CUFF TOURNIQUET SINGLE 24IN (TOURNIQUET CUFF) ×1 IMPLANT
DRAPE C-ARM 42X72 X-RAY (DRAPES) ×2 IMPLANT
DRAPE C-ARMOR (DRAPES) ×2 IMPLANT
DRAPE IMP U-DRAPE 54X76 (DRAPES) ×4 IMPLANT
DRAPE INCISE IOBAN 66X45 STRL (DRAPES) ×2 IMPLANT
DRAPE ORTHO SPLIT 77X108 STRL (DRAPES) ×4
DRAPE PROXIMA HALF (DRAPES) ×2 IMPLANT
DRAPE SURG ORHT 6 SPLT 77X108 (DRAPES) ×2 IMPLANT
DRAPE U-SHAPE 47X51 STRL (DRAPES) ×2 IMPLANT
DRILL BIT 2.5X100 214235007 (MISCELLANEOUS) ×1 IMPLANT
DRILL BIT 2.7X100 214235006 DU (MISCELLANEOUS) ×1 IMPLANT
DRSG PAD ABDOMINAL 8X10 ST (GAUZE/BANDAGES/DRESSINGS) ×1 IMPLANT
DURAPREP 26ML APPLICATOR (WOUND CARE) ×2 IMPLANT
ELECT REM PT RETURN 9FT ADLT (ELECTROSURGICAL) ×2
ELECTRODE REM PT RTRN 9FT ADLT (ELECTROSURGICAL) ×1 IMPLANT
FACESHIELD STD STERILE (MASK) ×2 IMPLANT
GAUZE XEROFORM 1X8 LF (GAUZE/BANDAGES/DRESSINGS) ×2 IMPLANT
GLOVE SKINSENSE NS SZ7.5 (GLOVE) ×1
GLOVE SKINSENSE STRL SZ7.5 (GLOVE) ×1 IMPLANT
GLOVE SURG SYN 7.5  E (GLOVE) ×1
GLOVE SURG SYN 7.5 E (GLOVE) ×1 IMPLANT
GLOVE SURG SYN 7.5 PF PI (GLOVE) ×1 IMPLANT
GOWN STRL REIN XL XLG (GOWN DISPOSABLE) ×6 IMPLANT
GRAFT BNE CANC CHIPS 1-8 20CC (Bone Implant) IMPLANT
K-WIRE ACE 1.6X6 (WIRE) ×10
KIT BASIN OR (CUSTOM PROCEDURE TRAY) ×2 IMPLANT
KIT ROOM TURNOVER OR (KITS) ×2 IMPLANT
KWIRE ACE 1.6X6 (WIRE) IMPLANT
MANIFOLD NEPTUNE II (INSTRUMENTS) ×2 IMPLANT
NDL SUT 6 .5 CRC .975X.05 MAYO (NEEDLE) IMPLANT
NEEDLE MAYO TAPER (NEEDLE)
NS IRRIG 1000ML POUR BTL (IV SOLUTION) ×2 IMPLANT
PACK GENERAL/GYN (CUSTOM PROCEDURE TRAY) ×2 IMPLANT
PAD ABD 8X10 STRL (GAUZE/BANDAGES/DRESSINGS) ×2 IMPLANT
PAD ARMBOARD 7.5X6 YLW CONV (MISCELLANEOUS) ×4 IMPLANT
PADDING CAST COTTON 6X4 STRL (CAST SUPPLIES) ×1 IMPLANT
PLATE LOCK 3H STD LT PROX TIB (Plate) ×1 IMPLANT
SCREW CORTICAL 3.5MM  42MM (Screw) ×1 IMPLANT
SCREW CORTICAL 3.5MM 38MM (Screw) ×1 IMPLANT
SCREW CORTICAL 3.5MM 42MM (Screw) IMPLANT
SCREW LOCK CORT STAR 3.5X50 (Screw) ×2 IMPLANT
SCREW LOCK CORT STAR 3.5X60 (Screw) ×2 IMPLANT
SCREW LOCK CORT STAR 3.5X65 (Screw) ×2 IMPLANT
SCREW LP 3.5X70MM (Screw) ×2 IMPLANT
SPONGE GAUZE 4X4 12PLY STER LF (GAUZE/BANDAGES/DRESSINGS) ×2 IMPLANT
STAPLER VISISTAT 35W (STAPLE) IMPLANT
STOCKINETTE IMPERVIOUS LG (DRAPES) ×1 IMPLANT
SUCTION FRAZIER TIP 8 FR DISP (SUCTIONS) ×1
SUCTION TUBE FRAZIER 8FR DISP (SUCTIONS) IMPLANT
SUT ETHILON 2 0 FS 18 (SUTURE) IMPLANT
SUT ETHILON 3 0 PS 1 (SUTURE) IMPLANT
SUT PDS AB 0 CT 36 (SUTURE) ×1 IMPLANT
SUT VIC AB 0 CT1 27 (SUTURE)
SUT VIC AB 0 CT1 27XBRD ANBCTR (SUTURE) IMPLANT
SUT VIC AB 1 CT1 27 (SUTURE) ×2
SUT VIC AB 1 CT1 27XBRD ANBCTR (SUTURE) IMPLANT
SUT VIC AB 2-0 CT1 27 (SUTURE) ×6
SUT VIC AB 2-0 CT1 TAPERPNT 27 (SUTURE) ×1 IMPLANT
TOWEL OR 17X24 6PK STRL BLUE (TOWEL DISPOSABLE) ×2 IMPLANT
TOWEL OR 17X26 10 PK STRL BLUE (TOWEL DISPOSABLE) ×4 IMPLANT

## 2016-11-11 NOTE — Progress Notes (Signed)
Report called to Short Stay. NO questions/complaints. 
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
## 2016-11-11 NOTE — Anesthesia Procedure Notes (Signed)
Procedure Name: Intubation Date/Time: 11/11/2016 5:24 PM Performed by: Sampson Si E Pre-anesthesia Checklist: Patient identified, Emergency Drugs available, Suction available and Patient being monitored Patient Re-evaluated:Patient Re-evaluated prior to inductionOxygen Delivery Method: Circle System Utilized Preoxygenation: Pre-oxygenation with 100% oxygen Intubation Type: IV induction Ventilation: Mask ventilation without difficulty Laryngoscope Size: Mac and 3 Grade View: Grade I Tube type: Oral Tube size: 7.0 mm Number of attempts: 1 Airway Equipment and Method: Stylet and Oral airway Placement Confirmation: ETT inserted through vocal cords under direct vision,  positive ETCO2 and breath sounds checked- equal and bilateral Secured at: 21 cm Tube secured with: Tape Dental Injury: Teeth and Oropharynx as per pre-operative assessment

## 2016-11-11 NOTE — Transfer of Care (Signed)
Immediate Anesthesia Transfer of Care Note  Patient: Marissa CowmanBobbie Jo Bolton  Procedure(s) Performed: Procedure(s): OPEN REDUCTION INTERNAL FIXATION (ORIF) LEFT TIBIAL PLATEAU (Left)  Patient Location: PACU  Anesthesia Type:General  Level of Consciousness: awake and alert   Airway & Oxygen Therapy: Patient Spontanous Breathing and Patient connected to nasal cannula oxygen  Post-op Assessment: Report given to RN and Post -op Vital signs reviewed and stable  Post vital signs: Reviewed and stable  Last Vitals:  Vitals:   11/11/16 0500 11/11/16 1337  BP: (!) 88/57 107/62  Pulse: 74 92  Resp: 16 16  Temp: 37 C 37.3 C    Last Pain:  Vitals:   11/11/16 1436  TempSrc:   PainSc: 2       Patients Stated Pain Goal: 2 (11/11/16 1406)  Complications: No apparent anesthesia complications

## 2016-11-11 NOTE — Anesthesia Preprocedure Evaluation (Addendum)
Anesthesia Evaluation  Patient identified by MRN, date of birth, ID band Patient awake    Reviewed: Allergy & Precautions, NPO status , Patient's Chart, lab work & pertinent test results  Airway Mallampati: II  TM Distance: >3 FB Neck ROM: Full    Dental  (+) Partial Upper, Teeth Intact, Dental Advisory Given   Pulmonary asthma , Current Smoker,    breath sounds clear to auscultation       Cardiovascular negative cardio ROS   Rhythm:Regular Rate:Normal     Neuro/Psych    GI/Hepatic negative GI ROS, Neg liver ROS,   Endo/Other  negative endocrine ROS  Renal/GU negative Renal ROS     Musculoskeletal   Abdominal   Peds  Hematology   Anesthesia Other Findings   Reproductive/Obstetrics                            Anesthesia Physical Anesthesia Plan  ASA: II  Anesthesia Plan: General   Post-op Pain Management:    Induction: Intravenous  Airway Management Planned: Oral ETT  Additional Equipment:   Intra-op Plan:   Post-operative Plan: Extubation in OR  Informed Consent: I have reviewed the patients History and Physical, chart, labs and discussed the procedure including the risks, benefits and alternatives for the proposed anesthesia with the patient or authorized representative who has indicated his/her understanding and acceptance.   Dental advisory given  Plan Discussed with: CRNA and Anesthesiologist  Anesthesia Plan Comments:         Anesthesia Quick Evaluation

## 2016-11-11 NOTE — Anesthesia Postprocedure Evaluation (Signed)
Anesthesia Post Note  Patient: Marissa Bolton  Procedure(s) Performed: Procedure(s) (LRB): OPEN REDUCTION INTERNAL FIXATION (ORIF) LEFT TIBIAL PLATEAU (Left)  Patient location during evaluation: PACU Anesthesia Type: General Level of consciousness: awake and alert Pain management: pain level controlled Vital Signs Assessment: post-procedure vital signs reviewed and stable Respiratory status: spontaneous breathing, nonlabored ventilation, respiratory function stable and patient connected to nasal cannula oxygen Cardiovascular status: blood pressure returned to baseline and stable Postop Assessment: no signs of nausea or vomiting Anesthetic complications: no       Last Vitals:  Vitals:   11/11/16 1945 11/11/16 2000  BP: 131/83 126/81  Pulse: (!) 140 (!) 131  Resp: 17   Temp:  36.7 C    Last Pain:  Vitals:   11/11/16 2000  TempSrc:   PainSc: 5     LLE Motor Response: Non-purposeful movement (11/11/16 2000)   RLE Motor Response: Purposeful movement (11/11/16 2000)        Khyron Garno,W. EDMOND
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
## 2016-11-12 ENCOUNTER — Encounter (HOSPITAL_COMMUNITY): Payer: Self-pay | Admitting: Physical Medicine and Rehabilitation

## 2016-11-12 DIAGNOSIS — B9689 Other specified bacterial agents as the cause of diseases classified elsewhere: Secondary | ICD-10-CM

## 2016-11-12 DIAGNOSIS — Z8249 Family history of ischemic heart disease and other diseases of the circulatory system: Secondary | ICD-10-CM

## 2016-11-12 DIAGNOSIS — S82141A Displaced bicondylar fracture of right tibia, initial encounter for closed fracture: Secondary | ICD-10-CM

## 2016-11-12 DIAGNOSIS — S82122A Displaced fracture of lateral condyle of left tibia, initial encounter for closed fracture: Secondary | ICD-10-CM

## 2016-11-12 DIAGNOSIS — S069X9A Unspecified intracranial injury with loss of consciousness of unspecified duration, initial encounter: Secondary | ICD-10-CM

## 2016-11-12 DIAGNOSIS — Z809 Family history of malignant neoplasm, unspecified: Secondary | ICD-10-CM

## 2016-11-12 DIAGNOSIS — Z1612 Extended spectrum beta lactamase (ESBL) resistance: Secondary | ICD-10-CM

## 2016-11-12 DIAGNOSIS — R8271 Bacteriuria: Secondary | ICD-10-CM

## 2016-11-12 DIAGNOSIS — Z833 Family history of diabetes mellitus: Secondary | ICD-10-CM

## 2016-11-12 DIAGNOSIS — N760 Acute vaginitis: Secondary | ICD-10-CM

## 2016-11-12 DIAGNOSIS — F17200 Nicotine dependence, unspecified, uncomplicated: Secondary | ICD-10-CM

## 2016-11-12 LAB — URINE CULTURE: Culture: >=100000 — AB

## 2016-11-12 LAB — BASIC METABOLIC PANEL
Anion gap: 6 (ref 5–15)
BUN: 5 mg/dL — ABNORMAL LOW (ref 6–20)
CALCIUM: 8.8 mg/dL — AB (ref 8.9–10.3)
CO2: 30 mmol/L (ref 22–32)
Chloride: 100 mmol/L — ABNORMAL LOW (ref 101–111)
Creatinine, Ser: 0.45 mg/dL (ref 0.44–1.00)
Glucose, Bld: 128 mg/dL — ABNORMAL HIGH (ref 65–99)
Potassium: 4 mmol/L (ref 3.5–5.1)
Sodium: 136 mmol/L (ref 135–145)

## 2016-11-12 LAB — CBC
HEMATOCRIT: 33.6 % — AB (ref 36.0–46.0)
Hemoglobin: 10.8 g/dL — ABNORMAL LOW (ref 12.0–15.0)
MCH: 27.4 pg (ref 26.0–34.0)
MCHC: 32.1 g/dL (ref 30.0–36.0)
MCV: 85.3 fL (ref 78.0–100.0)
PLATELETS: 865 10*3/uL — AB (ref 150–400)
RBC: 3.94 MIL/uL (ref 3.87–5.11)
RDW: 17 % — AB (ref 11.5–15.5)
WBC: 15.9 10*3/uL — AB (ref 4.0–10.5)

## 2016-11-12 LAB — POCT I-STAT 4, (NA,K, GLUC, HGB,HCT)
Glucose, Bld: 112 mg/dL — ABNORMAL HIGH (ref 65–99)
HEMATOCRIT: 31 % — AB (ref 36.0–46.0)
HEMOGLOBIN: 10.5 g/dL — AB (ref 12.0–15.0)
Potassium: 4.8 mmol/L (ref 3.5–5.1)
SODIUM: 133 mmol/L — AB (ref 135–145)

## 2016-11-12 MED ORDER — ENOXAPARIN SODIUM 40 MG/0.4ML ~~LOC~~ SOLN
40.0000 mg | SUBCUTANEOUS | Status: DC
  Administered 2016-11-12 – 2016-11-15 (×4): 40 mg via SUBCUTANEOUS
  Filled 2016-11-12 (×4): qty 0.4

## 2016-11-12 MED ORDER — WHITE PETROLATUM GEL
Status: AC
  Administered 2016-11-12: 12:00:00
  Filled 2016-11-12: qty 1

## 2016-11-12 MED ORDER — MEROPENEM 1 G IV SOLR
1.0000 g | Freq: Three times a day (TID) | INTRAVENOUS | Status: DC
  Administered 2016-11-12: 1 g via INTRAVENOUS
  Filled 2016-11-12 (×2): qty 1

## 2016-11-12 MED ORDER — PIPERACILLIN-TAZOBACTAM 3.375 G IVPB
3.3750 g | Freq: Three times a day (TID) | INTRAVENOUS | Status: DC
  Filled 2016-11-12: qty 50

## 2016-11-12 NOTE — Progress Notes (Signed)
   Subjective:  Patient reports pain as moderate.    Objective:   VITALS:   Vitals:   11/11/16 2000 11/11/16 2015 11/12/16 0232 11/12/16 0556  BP: 126/81 130/88 125/89 123/80  Pulse: (!) 131 (!) 140 (!) 142 (!) 153  Resp:  18 18 18   Temp: 98.1 F (36.7 C) 98.1 F (36.7 C) 98.8 F (37.1 C) 99.2 F (37.3 C)  TempSrc:  Oral Oral Oral  SpO2: 100% 98% 98% 96%  Weight:      Height:        Neurologically intact Neurovascular intact Sensation intact distally Intact pulses distally Dorsiflexion/Plantar flexion intact Incision: dressing C/D/I and no drainage No cellulitis present Compartment soft   Lab Results  Component Value Date   WBC 15.9 (H) 11/12/2016   HGB 10.8 (L) 11/12/2016   HCT 33.6 (L) 11/12/2016   MCV 85.3 11/12/2016   PLT 865 (H) 11/12/2016     Assessment/Plan:  1 Day Post-Op   - Expected postop acute blood loss anemia - will monitor for symptoms - Up with PT/OT - DVT ppx - SCDs, ambulation, lovenox - NWB BLE - Pain control  Marissa Bolton 11/12/2016, 8:01 AM [phone]
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
## 2016-11-12 NOTE — Progress Notes (Signed)
Orthopedic Tech Progress Note Patient Details:  Kristie CowmanBobbie Jo Price 1986-06-05 [id number]  Patient ID: Kristie CowmanBobbie Jo Price, female   DOB: 1986-06-05, 31 y.o.   MRN: [id number]   Saul FordyceJennifer C Ercia Crisafulli 11/12/2016, 9:16 AMCalled [employer] for left Bledsoe brace.
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
## 2016-11-12 NOTE — Progress Notes (Signed)
Ortho Tech notified about need for Bledsoe brace. Brace will be ordered in AM and placed by Tech after it arrives.  Cindee SaltMcBride,Haruye Lainez K, RN

## 2016-11-13 ENCOUNTER — Inpatient Hospital Stay (HOSPITAL_COMMUNITY): Payer: Medicaid Other

## 2016-11-13 DIAGNOSIS — Z9889 Other specified postprocedural states: Secondary | ICD-10-CM

## 2016-11-13 LAB — CBC
HCT: 35.3 % — ABNORMAL LOW (ref 36.0–46.0)
Hemoglobin: 11.4 g/dL — ABNORMAL LOW (ref 12.0–15.0)
MCH: 28.1 pg (ref 26.0–34.0)
MCHC: 32.3 g/dL (ref 30.0–36.0)
MCV: 86.9 fL (ref 78.0–100.0)
Platelets: 1041 10*3/uL (ref 150–400)
RBC: 4.06 MIL/uL (ref 3.87–5.11)
RDW: 17.4 % — AB (ref 11.5–15.5)
WBC: 12.3 10*3/uL — AB (ref 4.0–10.5)

## 2016-11-13 MED ORDER — TRAMADOL HCL 50 MG PO TABS
100.0000 mg | ORAL_TABLET | Freq: Four times a day (QID) | ORAL | Status: DC
  Administered 2016-11-13 – 2016-11-16 (×11): 100 mg via ORAL
  Filled 2016-11-13 (×11): qty 2

## 2016-11-13 MED ORDER — METHOCARBAMOL 500 MG PO TABS
500.0000 mg | ORAL_TABLET | Freq: Four times a day (QID) | ORAL | Status: DC | PRN
  Administered 2016-11-13 – 2016-11-15 (×8): 500 mg via ORAL
  Filled 2016-11-13 (×8): qty 1
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
## 2016-11-13 NOTE — Progress Notes (Signed)
*  PRELIMINARY RESULTS* Vascular Ultrasound Lower extremity venous duplex has been completed.  Preliminary findings: Technically limited due to bandages. Not all veins visualized. No evidence of DVT in visualized veins.    Farrel DemarkJill Eunice, RDMS, RVT  11/13/2016, 10:02 AM

## 2016-11-13 NOTE — Progress Notes (Addendum)
Inpatient Rehabilitation  Met with patient to discuss team's recommendation for IP Rehab.  Shared booklets and answered questions.  Patient eager to return home to her children; however, recognizes that she needs to get more independent prior to returning home.  Husband in agreement that he cannot care for patient right now.  Plan to proceed with IP Rehab admission Saturday 11/14/16; updated RNCM.  Please call with questions.   Update: discussed need to transition to PO pain meds prior to IP Rehab admission with RN attempted to call patient; however line was busy.  Carmelia Roller., CCC/SLP Admission Coordinator  [place]  Cell [phone]
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
## 2016-11-14 ENCOUNTER — Inpatient Hospital Stay (HOSPITAL_COMMUNITY): Payer: Self-pay | Admitting: Occupational Therapy

## 2016-11-14 LAB — CBC
HEMATOCRIT: 34.7 % — AB (ref 36.0–46.0)
HEMOGLOBIN: 11.2 g/dL — AB (ref 12.0–15.0)
MCH: 27.7 pg (ref 26.0–34.0)
MCHC: 32.3 g/dL (ref 30.0–36.0)
MCV: 85.9 fL (ref 78.0–100.0)
Platelets: 1123 10*3/uL (ref 150–400)
RBC: 4.04 MIL/uL (ref 3.87–5.11)
RDW: 16.9 % — AB (ref 11.5–15.5)
WBC: 12.7 10*3/uL — ABNORMAL HIGH (ref 4.0–10.5)

## 2016-11-14 LAB — TROPONIN I: Troponin I: <0.03 ng/mL (ref <0.03)
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
## 2016-11-14 NOTE — Discharge Planning (Signed)
Pt refusing to transfer to CIR. States she would like to "go home". MD and CIR notified.

## 2016-11-14 NOTE — Progress Notes (Signed)
Pt c/o chest pressure. BP elevated. MD notified. Orders received.
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
## 2016-11-15 ENCOUNTER — Inpatient Hospital Stay (HOSPITAL_COMMUNITY): Payer: Self-pay | Admitting: Physical Therapy

## 2016-11-15 ENCOUNTER — Inpatient Hospital Stay (HOSPITAL_COMMUNITY): Payer: Medicaid Other

## 2016-11-15 ENCOUNTER — Inpatient Hospital Stay (HOSPITAL_COMMUNITY): Payer: Self-pay | Admitting: Occupational Therapy

## 2016-11-15 LAB — CBC
HEMATOCRIT: 34.9 % — AB (ref 36.0–46.0)
HEMOGLOBIN: 11.4 g/dL — AB (ref 12.0–15.0)
MCH: 28.3 pg (ref 26.0–34.0)
MCHC: 32.7 g/dL (ref 30.0–36.0)
MCV: 86.6 fL (ref 78.0–100.0)
PLATELETS: 1224 10*3/uL — AB (ref 150–400)
RBC: 4.03 MIL/uL (ref 3.87–5.11)
RDW: 17 % — ABNORMAL HIGH (ref 11.5–15.5)
WBC: 11 10*3/uL — ABNORMAL HIGH (ref 4.0–10.5)

## 2016-11-15 LAB — COMPREHENSIVE METABOLIC PANEL
ALK PHOS: 75 U/L (ref 38–126)
ALT: 16 U/L (ref 14–54)
AST: 29 U/L (ref 15–41)
Albumin: 2.8 g/dL — ABNORMAL LOW (ref 3.5–5.0)
Anion gap: 7 (ref 5–15)
BUN: 10 mg/dL (ref 6–20)
CALCIUM: 9.3 mg/dL (ref 8.9–10.3)
CHLORIDE: 98 mmol/L — AB (ref 101–111)
CO2: 30 mmol/L (ref 22–32)
CREATININE: 0.62 mg/dL (ref 0.44–1.00)
Glucose, Bld: 103 mg/dL — ABNORMAL HIGH (ref 65–99)
Potassium: 4.2 mmol/L (ref 3.5–5.1)
Sodium: 135 mmol/L (ref 135–145)
Total Bilirubin: 0.5 mg/dL (ref 0.3–1.2)
Total Protein: 6.4 g/dL — ABNORMAL LOW (ref 6.5–8.1)

## 2016-11-15 MED ORDER — HYDROCODONE-ACETAMINOPHEN 5-325 MG PO TABS
1.0000 | ORAL_TABLET | ORAL | Status: DC | PRN
  Administered 2016-11-15 – 2016-11-16 (×4): 2 via ORAL
  Filled 2016-11-15 (×4): qty 2

## 2016-11-15 MED ORDER — HYDROCODONE-ACETAMINOPHEN 7.5-325 MG/15ML PO SOLN: 10.0000 mL | ORAL | Status: DC | PRN

## 2016-11-15 MED ORDER — HYDROMORPHONE HCL 2 MG/ML IJ SOLN
0.5000 mg | INTRAMUSCULAR | Status: DC | PRN
  Administered 2016-11-15 – 2016-11-16 (×5): 0.5 mg via INTRAVENOUS
  Filled 2016-11-15 (×7): qty 1
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
## 2016-11-15 NOTE — Progress Notes (Signed)
CM receives a consult from MD [facility] is refusing [facility]. CM confirms with pt she has no insurance and cannot pay out of pocket for HHPT/OT.  Per [facility] rep, Jermaine, pt does not qualify for HHPT/OT (as it is not a joint rehab need or stroke deficit; neither does pt have a  nursing need which qualifies for charity.  Pt states her grandmother's church will be providing wheelchair. CM spoke with RN who states pt will NOT discharge today.

## 2016-11-16 ENCOUNTER — Inpatient Hospital Stay (HOSPITAL_COMMUNITY): Payer: Self-pay | Admitting: Physical Therapy

## 2016-11-16 ENCOUNTER — Inpatient Hospital Stay (HOSPITAL_COMMUNITY): Payer: Self-pay | Admitting: Occupational Therapy

## 2016-11-16 ENCOUNTER — Inpatient Hospital Stay (HOSPITAL_COMMUNITY): Payer: Medicaid Other

## 2016-11-16 LAB — CULTURE, RESPIRATORY W GRAM STAIN

## 2016-11-16 LAB — CULTURE, RESPIRATORY: CULTURE: NORMAL

## 2016-11-16 LAB — PATHOLOGIST SMEAR REVIEW

## 2016-11-16 MED ORDER — ACETAMINOPHEN 325 MG PO TABS: 650.0000 mg | ORAL_TABLET | Freq: Four times a day (QID) | ORAL | Status: AC | PRN

## 2016-11-16 MED ORDER — PNEUMOCOCCAL 13-VAL CONJ VACC IM SUSP
0.5000 mL | INTRAMUSCULAR | Status: AC | PRN
  Administered 2016-11-16: 0.5 mL via INTRAMUSCULAR
  Filled 2016-11-16: qty 0.5

## 2016-11-16 MED ORDER — MENINGOCOCCAL VAC A,C,Y,W-135 ~~LOC~~ INJ
0.5000 mL | INJECTION | SUBCUTANEOUS | Status: DC | PRN
  Filled 2016-11-16: qty 0.5

## 2016-11-16 MED ORDER — OXYCODONE HCL 5 MG PO TABS
5.0000 mg | ORAL_TABLET | ORAL | Status: DC | PRN
  Administered 2016-11-16 (×2): 10 mg via ORAL
  Filled 2016-11-16 (×2): qty 2

## 2016-11-16 MED ORDER — TRAMADOL HCL 50 MG PO TABS: 100.0000 mg | ORAL_TABLET | Freq: Four times a day (QID) | ORAL | 0 refills | Status: AC | PRN

## 2016-11-16 MED ORDER — HAEMOPHILUS B POLYSAC CONJ VAC IM SOLR
0.5000 mL | INTRAMUSCULAR | Status: AC | PRN
  Administered 2016-11-16: 0.5 mL via INTRAMUSCULAR
  Filled 2016-11-16: qty 0.5

## 2016-11-16 MED ORDER — MENINGOCOCCAL A C Y&W-135 OLIG IM SOLR
0.5000 mL | INTRAMUSCULAR | Status: AC | PRN
  Administered 2016-11-16: 0.5 mL via INTRAMUSCULAR
  Filled 2016-11-16: qty 0.5

## 2016-11-16 MED ORDER — ENOXAPARIN SODIUM 40 MG/0.4ML ~~LOC~~ SOLN: 40.0000 mg | SUBCUTANEOUS | 0 refills | Status: AC

## 2016-11-16 MED ORDER — OXYCODONE HCL 5 MG PO TABS: 5.0000 mg | ORAL_TABLET | ORAL | 0 refills | Status: DC | PRN

## 2016-11-16 MED ORDER — METRONIDAZOLE 500 MG PO TABS: 500.0000 mg | ORAL_TABLET | Freq: Two times a day (BID) | ORAL | 0 refills | Status: AC
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
## 2016-11-16 NOTE — Anesthesia Postprocedure Evaluation (Signed)
Anesthesia Post Note  Patient: Marissa Bolton  Procedure(s) Performed: Procedure(s) (LRB): EXPLORATORY LAPAROTOMY (N/A) SPLENECTOMY (N/A)  Patient location during evaluation: NICU Anesthesia Type: General Level of consciousness: sedated and patient remains intubated per anesthesia plan Pain management: pain level controlled Vital Signs Assessment: post-procedure vital signs reviewed and stable Respiratory status: patient on ventilator - see flowsheet for VS and patient remains intubated per anesthesia plan Cardiovascular status: blood pressure returned to baseline Anesthetic complications: no       Last Vitals:  Vitals:   11/16/16 0636 11/16/16 1028  BP: 98/61 103/61  Pulse: (!) 101 99  Resp: 16 18  Temp: 36.4 C 36.6 C    Last Pain:  Vitals:   11/16/16 1350  TempSrc:   PainSc: 10-Worst pain ever                 Darius Fillingim COKER

## 2016-11-16 NOTE — Progress Notes (Signed)
Pt discharged to home.  Discharge instructions explained to pt.  Pt rcvd ordered vaccinations prior to discharge.  Central line removed prior to discharge.  Pt states she has all belongings.  Pt taken off unit via own wheelchair by staff.
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
## 2016-11-16 NOTE — Discharge Instructions (Signed)
You may not bear weight on your legs for 6 weeks. Continue to do exercises as recommended by physical therapy while at home. Continue to take daily Lovenox injections to prevent blood clots. Follow-up with orthopedic surgery in 1 week for evaluation and suture removal.  You have been given vaccinations prior to discharge that are important for patient's who have had their spleen surgically removed. You need to make an appointment with your primary care provider or go to the health department in 8 weeks for your second pneumococcal vaccination.  You should refrain from lifting anything over 15 pounds for 4 weeks, to prevent complications around your abdominal incision. Your staples have been removed without complication and your incision is healing appropriately. You may shower daily with mild soap and water but should avoid soaking in a bathtub for 1-2 weeks.   Open Splenectomy, Care After Refer to this sheet in the next few weeks. These instructions provide you with information about caring for yourself after your procedure. Your health care provider may also give you more specific instructions. Your treatment has been planned according to current medical practices, but problems sometimes occur. Call your health care provider if you have any problems or questions after your procedure. What can I expect after the procedure? After the procedure, it is common to have:  Mild pain.  Lack of energy. Follow these instructions at home: Medicines   Take over-the-counter and prescription medicines only as told by your health care provider.  Do not drive or operate heavy machinery while taking prescription pain medicine.  Talk with your health care provider about the need for vaccinations to help prevent infections. Incision care    Follow instructions from your health care provider about how to take care of your incision. Make sure you:  Wash your hands with soap and water before you change  your bandage (dressing). If soap and water are not available, use hand sanitizer.  Change your dressing as told by your health care provider.  Leave stitches (sutures), skin glue, or adhesive strips in place. These skin closures may need to be in place for 2 weeks or longer. If adhesive strip edges start to loosen and curl up, you may trim the loose edges. Do not remove adhesive strips completely unless your health care provider tells you to do that.  Check your incision area every day for signs of infection. Check for:  More redness, swelling, or pain.  More fluid or blood.  Warmth.  Pus or a bad smell. Eating and drinking   Drink enough fluid to keep your urine clear or pale yellow.  Return to your normal diet as told by your health care provider. Activity   Return to your normal activities as told by your health care provider. Ask your health care provider what activities are safe for you.  Avoid strenuous activity for 4 weeks or as told by your health care provider.  Do not lift anything that is heavier than 10-15 lb (4.5 kg).  Walk as much as possible.  Ask your health care provider when it is safe to drive, have sex, or go back to work. General instructions   Continue to practice deep breathing as told by your health care provider.  Always tell your health care providers that you do not have a spleen before you have any procedures. These include medical and dental procedures.  Keep all follow-up visits as told by your health care provider. This is important. Contact a health care provider  if:  You have pain that is not helped by medicine.  You have more redness, swelling, or pain around your incision.  You have more fluid or blood coming from your incision.  Your incision feels warm to the touch.  You have pus or a bad smell coming from your incision.  You have a fever or chills.  You have nausea or vomiting. Get help right away if:  Your pain gets much  worse.  Your legs are red, swollen, or painful.  You have chest pain.  You have trouble breathing.  You suddenly feel very weak or dizzy. This information is not intended to replace advice given to you by your health care provider. Make sure you discuss any questions you have with your health care provider. Document Released: 12/26/2010 Document Revised: 02/06/2016 Document Reviewed: 05/14/2015 Elsevier Interactive Patient Education  2017 [employer]

## 2016-11-16 NOTE — Care Management Note (Signed)
Case Management Note  Patient Details  Name: Marissa Bolton MRN: [id number] Date of Birth: 01-26-1986  Subjective/Objective:   Pt refused CIR admission over the weekend.  She desires to go home with husband and family's assistance; she wants to see her children.  Pt uninsured, and does not qualify for home therapies; she is unable to self pay for therapy at home.              Action/Plan: OT worked with pt this AM; states pt did well with transfers without husband's assistance.  Feels pt is safe to go home with needed DME.  [facility].  Pt qualified,and recommended DME delivered to room.  Pt qualifies for medication assistance through [facility].  [facility] of [facility] benefits.  [facility] pt [facility]  Bedside nurse aware pt is discharging on Lovenox and will need Lovenox injection teaching prior to dc.    Expected Discharge Date:  11/16/16               Expected Discharge Plan:  IP Rehab Facility  In-House Referral:     Discharge planning Services  CM Consult, [facility]  Post Acute Care Choice:    Choice offered to:     DME Arranged:  3-N-1, Wheelchair manual, Tub bench DME Agency:  [facility]  [facility]     Status of Service:  Completed, signed off  If discussed at Long Length of Stay Meetings, dates discussed:    Additional Comments:  Quintella BatonJulie W. Janin Kozlowski, RN, BSN  Trauma/Neuro ICU Case Manager [phone]

## 2016-11-16 NOTE — Progress Notes (Signed)
Inpatient Rehabilitation  Pt. was scheduled to admit to CIR on 11/14/16, however has decided she will discharge home instead.  She states she has a sister in law who is a CNA and will be assisting her.  I will sign off.  Please call if questions.  Weldon PickingSusan Amonie Wisser PT Inpatient Rehab Admissions Coordinator Cell [phone] Office [phone]

## 2016-11-16 NOTE — Anesthesia Postprocedure Evaluation (Signed)
Anesthesia Post Note  Patient: Marissa Bolton  Procedure(s) Performed: Procedure(s) (LRB): EXPLORATORY LAPAROTOMY (N/A) SPLENECTOMY (N/A)  Anesthesia Post Evaluation     Last Vitals:  Vitals:   11/16/16 0636 11/16/16 1028  BP: 98/61 103/61  Pulse: (!) 101 99  Resp: 16 18  Temp: 36.4 C 36.6 C    Last Pain:  Vitals:   11/16/16 1350  TempSrc:   PainSc: 10-Worst pain ever                 Jeremy Mclamb COKER
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
## 2016-11-20 ENCOUNTER — Other Ambulatory Visit (INDEPENDENT_AMBULATORY_CARE_PROVIDER_SITE_OTHER): Payer: Self-pay | Admitting: Family

## 2016-11-20 ENCOUNTER — Telehealth (INDEPENDENT_AMBULATORY_CARE_PROVIDER_SITE_OTHER): Payer: Self-pay | Admitting: Orthopaedic Surgery

## 2016-11-20 MED ORDER — OXYCODONE HCL 5 MG PO TABS: 5.0000 mg | ORAL_TABLET | ORAL | 0 refills | Status: AC | PRN

## 2016-11-20 NOTE — Telephone Encounter (Signed)
Patient called again and is needing something today before we close at 12.  Can you advise if possible since Dr Roda Shuttersxu is out today and Monday please and thank you.

## 2016-11-20 NOTE — Telephone Encounter (Signed)
See message below °

## 2016-11-20 NOTE — Telephone Encounter (Signed)
Please advise. Thank you

## 2016-11-20 NOTE — Telephone Encounter (Signed)
Pt requesting something to help her rest and for pain. Only has one pain pill left.  [phone]

## 2016-11-20 NOTE — Telephone Encounter (Signed)
Rx printed and ready for pick up at the front desk. Patient aware.

## 2016-11-23 NOTE — Telephone Encounter (Signed)
She can have norco 7.5 #30

## 2016-11-24 ENCOUNTER — Ambulatory Visit (INDEPENDENT_AMBULATORY_CARE_PROVIDER_SITE_OTHER): Payer: Self-pay

## 2016-11-24 ENCOUNTER — Ambulatory Visit (INDEPENDENT_AMBULATORY_CARE_PROVIDER_SITE_OTHER): Payer: Medicaid Other | Admitting: Orthopaedic Surgery

## 2016-11-24 DIAGNOSIS — S82142A Displaced bicondylar fracture of left tibia, initial encounter for closed fracture: Secondary | ICD-10-CM | POA: Insufficient documentation

## 2016-11-24 DIAGNOSIS — S82141D Displaced bicondylar fracture of right tibia, subsequent encounter for closed fracture with routine healing: Secondary | ICD-10-CM

## 2016-11-24 DIAGNOSIS — S82142D Displaced bicondylar fracture of left tibia, subsequent encounter for closed fracture with routine healing: Secondary | ICD-10-CM

## 2016-11-24 MED ORDER — OXYCODONE-ACETAMINOPHEN 5-325 MG PO TABS: 1.0000 | ORAL_TABLET | Freq: Three times a day (TID) | ORAL | 0 refills | Status: DC | PRN

## 2016-11-24 NOTE — Telephone Encounter (Signed)
rx given already

## 2016-11-24 NOTE — Progress Notes (Signed)
Patient is 3 weeks status post ORIF right bicondylar tibial plateau fracture and 2 weeks status post ORIF left lateral tibial plateau fracture. The surgical incisions are healed. Her x-rays show stable fixation without complications. Her exam is benign and shows expected postoperative swelling. She has no focal deficits. Continue bilateral lower extremity nonweightbearing. She is to use Lovenox for 4 full weeks for DVT prophylaxis and then may transition to aspirin 325 daily for another couple weeks after that. I will see her back next week for staple removal on the left side. The staples were removed from the right side today.

## 2016-11-30 ENCOUNTER — Telehealth (INDEPENDENT_AMBULATORY_CARE_PROVIDER_SITE_OTHER): Payer: Self-pay | Admitting: *Deleted

## 2016-11-30 NOTE — Telephone Encounter (Signed)
Patient called in this morning wanting to know if she could get a prescription refill on her Percocet. Her CB # [phone]. Thank you

## 2016-11-30 NOTE — Telephone Encounter (Signed)
Please advise 

## 2016-11-30 NOTE — Telephone Encounter (Signed)
She was prescribed oxycodone recently  1. 11/20/16  #30 By Denny PeonErin  2. 11/24/16 #40 By Dr Roda ShuttersXu  Do you still want me to give her a Rf? Please advise. Thanks.

## 2016-11-30 NOTE — Telephone Encounter (Signed)
No. Thank you for letting me know.

## 2016-11-30 NOTE — Telephone Encounter (Signed)
Refill #10

## 2016-12-01 ENCOUNTER — Ambulatory Visit (INDEPENDENT_AMBULATORY_CARE_PROVIDER_SITE_OTHER): Payer: Medicaid Other | Admitting: Orthopaedic Surgery

## 2016-12-01 ENCOUNTER — Encounter (INDEPENDENT_AMBULATORY_CARE_PROVIDER_SITE_OTHER): Payer: Self-pay | Admitting: Orthopaedic Surgery

## 2016-12-01 ENCOUNTER — Telehealth (INDEPENDENT_AMBULATORY_CARE_PROVIDER_SITE_OTHER): Payer: Self-pay | Admitting: Orthopaedic Surgery

## 2016-12-01 DIAGNOSIS — S82141D Displaced bicondylar fracture of right tibia, subsequent encounter for closed fracture with routine healing: Secondary | ICD-10-CM

## 2016-12-01 DIAGNOSIS — S82142D Displaced bicondylar fracture of left tibia, subsequent encounter for closed fracture with routine healing: Secondary | ICD-10-CM

## 2016-12-01 NOTE — Progress Notes (Signed)
Patient returns today for suture removal. Patient complains of chronic pain. She is very anxious about any sort of pain. She has not started physical therapy due to insurance issues. The surgical incisions have healed up well. There is no signs of infection. Minimal swelling. The staples were removed. I had a long discussion with the patient and her husband about motivating herself to move her knee and to prevent stiffness. I'm afraid that she is going to develop arthrofibrosis if she does not push past the pain at least a little bit. She seems to be very unmotivated in rehabbing. She is due for another refill on her pain medicines in about a week. She cannot have anything sooner than that. We'll see her back in 3 weeks with 2 view x-rays of bilateral knees. Anticipate allowing her to weight-bear at that time.

## 2016-12-01 NOTE — Telephone Encounter (Signed)
Patient has a scheduled appt today

## 2016-12-01 NOTE — Telephone Encounter (Signed)
Advised pt needs CPM. Dorian said Need to wait for Auth  To see patient again and for the  CPM. [facility] will call us by the end of the week to see if ins has authorized it. Dorian will call us back.

## 2016-12-01 NOTE — Telephone Encounter (Signed)
No restrictions.  Can we get her set up with a CPM

## 2016-12-01 NOTE — Telephone Encounter (Signed)
Please advise. Thanks.  

## 2016-12-01 NOTE — Telephone Encounter (Signed)
Dorian from [facility] called for pt's range of motion restrictions. Requested a call back [phone]

## 2016-12-03 ENCOUNTER — Telehealth (INDEPENDENT_AMBULATORY_CARE_PROVIDER_SITE_OTHER): Payer: Self-pay | Admitting: Orthopaedic Surgery

## 2016-12-03 NOTE — Telephone Encounter (Signed)
You can call her at this number as well.  [phone], this is her Aunt's #.

## 2016-12-03 NOTE — Telephone Encounter (Signed)
Marissa Bolton states that the have not received authorization from the patient's insurance company to start physical therapy. He also states they Dr Roda ShuttersXu could go ahead and place the order for there CPM machine since its through another company.

## 2016-12-03 NOTE — Telephone Encounter (Signed)
yes

## 2016-12-03 NOTE — Telephone Encounter (Signed)
See message below °

## 2016-12-04 ENCOUNTER — Telehealth (INDEPENDENT_AMBULATORY_CARE_PROVIDER_SITE_OTHER): Payer: Self-pay | Admitting: Orthopaedic Surgery

## 2016-12-04 NOTE — Telephone Encounter (Signed)
Refill #30. 1 tab po bid prn pain

## 2016-12-04 NOTE — Telephone Encounter (Signed)
Pt requests refill of pain medication.  [phone]

## 2016-12-04 NOTE — Telephone Encounter (Signed)
Please advise 

## 2016-12-07 ENCOUNTER — Telehealth (INDEPENDENT_AMBULATORY_CARE_PROVIDER_SITE_OTHER): Payer: Self-pay | Admitting: Orthopaedic Surgery

## 2016-12-07 MED ORDER — OXYCODONE-ACETAMINOPHEN 5-325 MG PO TABS: ORAL_TABLET | ORAL | 0 refills | Status: DC

## 2016-12-07 NOTE — Telephone Encounter (Signed)
It's normal for swelling.  Why is she falling so frequently?

## 2016-12-07 NOTE — Telephone Encounter (Signed)
Rx printed

## 2016-12-07 NOTE — Telephone Encounter (Signed)
Pt stated she is falling because there Is a step in their kitchen and if she is alone she is having trouble getting around by herself. [phone]

## 2016-12-07 NOTE — Telephone Encounter (Signed)
See message below °

## 2016-12-07 NOTE — Addendum Note (Signed)
Addended by: Albertina ParrGARCIA, Camara Rosander on: 12/07/2016 08:34 AM   Modules accepted: Orders

## 2016-12-07 NOTE — Telephone Encounter (Signed)
Please advise 

## 2016-12-07 NOTE — Telephone Encounter (Signed)
PT CALLED TO STATE HER LEGS ARE SWELLING UP ON HER AND SHE FELL YESTERDAY. WANTS TO BE ADVISED ON WHAT TO DO.  [phone]

## 2016-12-17 ENCOUNTER — Other Ambulatory Visit (INDEPENDENT_AMBULATORY_CARE_PROVIDER_SITE_OTHER): Payer: Self-pay | Admitting: Family

## 2016-12-17 ENCOUNTER — Telehealth (INDEPENDENT_AMBULATORY_CARE_PROVIDER_SITE_OTHER): Payer: Self-pay

## 2016-12-17 MED ORDER — OXYCODONE-ACETAMINOPHEN 5-325 MG PO TABS: ORAL_TABLET | ORAL | 0 refills | Status: AC

## 2016-12-17 NOTE — Telephone Encounter (Signed)
Can you advise please since Dr Roda Shutters is out of the office. Thank you so much for your help. Patient had 2 SU  in Feb 2018

## 2016-12-17 NOTE — Telephone Encounter (Signed)
Patient calling asking for pain medication refill

## 2016-12-22 ENCOUNTER — Ambulatory Visit (INDEPENDENT_AMBULATORY_CARE_PROVIDER_SITE_OTHER): Payer: Medicaid Other | Admitting: Orthopaedic Surgery

## 2016-12-22 ENCOUNTER — Telehealth (INDEPENDENT_AMBULATORY_CARE_PROVIDER_SITE_OTHER): Payer: Self-pay | Admitting: Orthopaedic Surgery

## 2016-12-22 ENCOUNTER — Encounter (INDEPENDENT_AMBULATORY_CARE_PROVIDER_SITE_OTHER): Payer: Self-pay | Admitting: Orthopaedic Surgery

## 2016-12-22 ENCOUNTER — Ambulatory Visit (INDEPENDENT_AMBULATORY_CARE_PROVIDER_SITE_OTHER): Payer: Self-pay

## 2016-12-22 ENCOUNTER — Ambulatory Visit (INDEPENDENT_AMBULATORY_CARE_PROVIDER_SITE_OTHER): Payer: Medicaid Other

## 2016-12-22 DIAGNOSIS — M25561 Pain in right knee: Secondary | ICD-10-CM | POA: Diagnosis not present

## 2016-12-22 DIAGNOSIS — S82141D Displaced bicondylar fracture of right tibia, subsequent encounter for closed fracture with routine healing: Secondary | ICD-10-CM

## 2016-12-22 DIAGNOSIS — S82142D Displaced bicondylar fracture of left tibia, subsequent encounter for closed fracture with routine healing: Secondary | ICD-10-CM | POA: Diagnosis not present

## 2016-12-22 NOTE — Telephone Encounter (Signed)
ok 

## 2016-12-22 NOTE — Telephone Encounter (Signed)
Brien Few with [facility] called advised they do not service [facility] right now so can not see the patient. The number to contact Lawson Fiscal is [phone]
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
## 2016-12-22 NOTE — Telephone Encounter (Signed)
Please advise 

## 2016-12-23 ENCOUNTER — Telehealth (INDEPENDENT_AMBULATORY_CARE_PROVIDER_SITE_OTHER): Payer: Self-pay | Admitting: Orthopaedic Surgery

## 2016-12-23 NOTE — Telephone Encounter (Signed)
Kelly @ [employer] called, she received faxed order for W/C and needs copy of last ov note faxed. I faxed to [phone]

## 2016-12-24 ENCOUNTER — Telehealth (INDEPENDENT_AMBULATORY_CARE_PROVIDER_SITE_OTHER): Payer: Self-pay | Admitting: *Deleted

## 2016-12-24 NOTE — Telephone Encounter (Signed)
I guess that's what medicaid only allows.  They only allow 1 outpatient visit so at least it's better than that.

## 2016-12-24 NOTE — Telephone Encounter (Signed)
Patient called in this morning regarding having some concerns about her physical therapy that was ordered. Her insurance will only pay for three visits to come to the house for home health. Her CB #[phone].

## 2016-12-24 NOTE — Telephone Encounter (Signed)
See message below °

## 2016-12-25 ENCOUNTER — Telehealth (INDEPENDENT_AMBULATORY_CARE_PROVIDER_SITE_OTHER): Payer: Self-pay | Admitting: Orthopaedic Surgery

## 2016-12-25 NOTE — Telephone Encounter (Signed)
See message below °

## 2016-12-25 NOTE — Telephone Encounter (Signed)
She should not need refill on lovenox.  She may have #30 of norco 5 only.  No oxycodone at this point.

## 2016-12-25 NOTE — Telephone Encounter (Signed)
Patient called asking for a refill on oxycodone and she also needed the shot for the blood clots in her legs. CB # [phone]

## 2016-12-28 ENCOUNTER — Telehealth (INDEPENDENT_AMBULATORY_CARE_PROVIDER_SITE_OTHER): Payer: Self-pay

## 2016-12-28 MED ORDER — HYDROCODONE-ACETAMINOPHEN 5-325 MG PO TABS: 1.0000 | ORAL_TABLET | Freq: Two times a day (BID) | ORAL | 0 refills | Status: DC | PRN

## 2016-12-28 NOTE — Telephone Encounter (Signed)
Called pt to let her know per Dr. Roda Shutters she shouldn't be getting Narcotics from multiple providers. She said it was only a one time thing but I advised her that on the report it showed different providers. Betsy answered the triage phone and told [facility] it was okay to fill this time per Dr Roda Shutters.

## 2016-12-28 NOTE — Telephone Encounter (Signed)
Pt called to see if medication was ready to pick up, I read pt this message and then she stated her medicaid will not cover the walker that was prescribed. Pt stated she is doing it without it and has fallen a couple times.

## 2016-12-28 NOTE — Telephone Encounter (Signed)
See message below °

## 2016-12-28 NOTE — Telephone Encounter (Signed)
Rx ready for pickup . Jodene Nam advised on message

## 2016-12-28 NOTE — Addendum Note (Signed)
Addended by: Albertina Parr on: 12/28/2016 10:11 AM   Modules accepted: Orders

## 2017-01-04 ENCOUNTER — Telehealth (INDEPENDENT_AMBULATORY_CARE_PROVIDER_SITE_OTHER): Payer: Self-pay | Admitting: Orthopaedic Surgery

## 2017-01-04 NOTE — Telephone Encounter (Signed)
Patient called asked if the letter for her to be out of work for 1 year has be mailed? Patient said she saw Dr Roda Shutters 3 weeks ago. Patient asked if there is anything Dr Roda Shutters can prescribe for her to help her straighten her legs. The number to contact patient is [phone]

## 2017-01-05 NOTE — Telephone Encounter (Signed)
What about the letter?

## 2017-01-05 NOTE — Telephone Encounter (Signed)
Nothing I can prescribe to straighten her legs.  She has to work on stretching out the back of her knees.

## 2017-01-05 NOTE — Telephone Encounter (Signed)
yes

## 2017-01-05 NOTE — Telephone Encounter (Signed)
Please advise on message. Thanks

## 2017-01-06 NOTE — Telephone Encounter (Signed)
Patient called back and I advised.

## 2017-01-06 NOTE — Telephone Encounter (Signed)
Called [facility] to advise on messsage. Letter is being mailed today

## 2017-01-11 ENCOUNTER — Telehealth (INDEPENDENT_AMBULATORY_CARE_PROVIDER_SITE_OTHER): Payer: Self-pay | Admitting: Orthopaedic Surgery

## 2017-01-11 NOTE — Telephone Encounter (Signed)
Patient called needing a refill on hydrocodone. CB # [phone]

## 2017-01-11 NOTE — Telephone Encounter (Signed)
#  20. 1 daily prn

## 2017-01-11 NOTE — Telephone Encounter (Signed)
Please advise 

## 2017-01-12 MED ORDER — HYDROCODONE-ACETAMINOPHEN 5-325 MG PO TABS: 1.0000 | ORAL_TABLET | Freq: Every day | ORAL | 0 refills | Status: AC | PRN

## 2017-01-12 NOTE — Addendum Note (Signed)
Addended by: Mardene Celeste B on: 01/12/2017 01:45 PM   Modules accepted: Orders

## 2017-01-12 NOTE — Telephone Encounter (Signed)
Rx ready for pick up at front desk. Patient is aware.

## 2017-02-02 ENCOUNTER — Ambulatory Visit (INDEPENDENT_AMBULATORY_CARE_PROVIDER_SITE_OTHER): Payer: Medicaid Other | Admitting: Orthopaedic Surgery

## 2017-02-15 ENCOUNTER — Ambulatory Visit (INDEPENDENT_AMBULATORY_CARE_PROVIDER_SITE_OTHER): Payer: Medicaid Other | Admitting: Orthopaedic Surgery

## 2017-08-03 ENCOUNTER — Ambulatory Visit (INDEPENDENT_AMBULATORY_CARE_PROVIDER_SITE_OTHER): Payer: Self-pay | Admitting: Orthopaedic Surgery

## 2017-08-09 ENCOUNTER — Ambulatory Visit (INDEPENDENT_AMBULATORY_CARE_PROVIDER_SITE_OTHER): Payer: Self-pay | Admitting: Orthopaedic Surgery

## 2017-08-12 ENCOUNTER — Ambulatory Visit (INDEPENDENT_AMBULATORY_CARE_PROVIDER_SITE_OTHER): Payer: Self-pay | Admitting: Orthopaedic Surgery

## 2018-04-13 ENCOUNTER — Encounter (HOSPITAL_COMMUNITY): Payer: Self-pay | Admitting: *Deleted

## 2018-04-13 ENCOUNTER — Other Ambulatory Visit: Payer: Self-pay

## 2018-04-13 ENCOUNTER — Emergency Department (HOSPITAL_COMMUNITY)
Admission: EM | Admit: 2018-04-13 | Discharge: 2018-04-13 | Disposition: A | Discharge status: Home / Self Care | Payer: Self-pay | Attending: Emergency Medicine | Admitting: Emergency Medicine

## 2018-04-13 DIAGNOSIS — F17210 Nicotine dependence, cigarettes, uncomplicated: Secondary | ICD-10-CM | POA: Insufficient documentation

## 2018-04-13 DIAGNOSIS — Z7901 Long term (current) use of anticoagulants: Secondary | ICD-10-CM | POA: Insufficient documentation

## 2018-04-13 DIAGNOSIS — R590 Localized enlarged lymph nodes: Secondary | ICD-10-CM | POA: Insufficient documentation

## 2018-04-13 DIAGNOSIS — N3001 Acute cystitis with hematuria: Secondary | ICD-10-CM | POA: Insufficient documentation

## 2018-04-13 DIAGNOSIS — Z79899 Other long term (current) drug therapy: Secondary | ICD-10-CM | POA: Insufficient documentation

## 2018-04-13 DIAGNOSIS — J45909 Unspecified asthma, uncomplicated: Secondary | ICD-10-CM | POA: Insufficient documentation

## 2018-04-13 LAB — URINALYSIS, ROUTINE W REFLEX MICROSCOPIC
Bilirubin Urine: NEGATIVE
Glucose, UA: NEGATIVE mg/dL
HGB URINE DIPSTICK: NEGATIVE
Ketones, ur: NEGATIVE mg/dL
Nitrite: NEGATIVE
PROTEIN: NEGATIVE mg/dL
Specific Gravity, Urine: 1.023 (ref 1.005–1.030)
pH: 5 (ref 5.0–8.0)

## 2018-04-13 LAB — PREGNANCY, URINE: PREG TEST UR: NEGATIVE

## 2018-04-13 MED ORDER — CEPHALEXIN 500 MG PO CAPS
500.0000 mg | ORAL_CAPSULE | Freq: Once | ORAL | Status: AC
  Administered 2018-04-13: 500 mg via ORAL
  Filled 2018-04-13: qty 1

## 2018-04-13 MED ORDER — CEPHALEXIN 500 MG PO CAPS: 500.0000 mg | ORAL_CAPSULE | Freq: Four times a day (QID) | ORAL | 0 refills | Status: DC

## 2018-04-13 MED ORDER — IBUPROFEN 600 MG PO TABS: 600.0000 mg | ORAL_TABLET | Freq: Four times a day (QID) | ORAL | 0 refills | Status: AC | PRN

## 2018-04-13 MED ORDER — CEPHALEXIN 500 MG PO CAPS: 500.0000 mg | ORAL_CAPSULE | Freq: Four times a day (QID) | ORAL | 0 refills | Status: AC

## 2018-04-13 MED ORDER — IBUPROFEN 600 MG PO TABS: 600.0000 mg | ORAL_TABLET | Freq: Four times a day (QID) | ORAL | 0 refills | Status: DC | PRN

## 2018-04-13 MED ORDER — IBUPROFEN 400 MG PO TABS
400.0000 mg | ORAL_TABLET | Freq: Once | ORAL | Status: AC
  Administered 2018-04-13: 400 mg via ORAL
  Filled 2018-04-13: qty 1

## 2018-04-13 NOTE — Discharge Instructions (Addendum)
Take your entire course of the antibiotics prescribed. Make sure you are drinking plenty of fluids to flush your kidneys and bladder.  You may take ibuprofen for pain relief.

## 2018-04-13 NOTE — ED Triage Notes (Signed)
Pt has multiple complaints; pt is c/o lumps on her neck, sore throat and states she has a kidney infection; pt states this has been going on for months
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
## 2018-04-17 LAB — URINE CULTURE

## 2018-04-18 ENCOUNTER — Telehealth: Payer: Self-pay | Admitting: Emergency Medicine

## 2018-04-18 NOTE — Telephone Encounter (Signed)
Post ED Visit - Positive Culture Follow-up  Culture report reviewed by antimicrobial stewardship pharmacist:  []  Marissa Bolton, Pharm.D. []  Marissa Bolton, Pharm.D., BCPS AQ-ID []  Marissa Bolton, Pharm.D., BCPS []  Marissa Bolton, Pharm.D., BCPS []  Marissa Bolton, [address]Pharm[address]D., BCPS, AAHIVP []  Marissa Bolton, Pharm.D., BCPS, AAHIVP []  Marissa Bolton, PharmD, BCPS []  Marissa Bolton, PharmD, BCPS []  Marissa Bolton, PharmD, BCPS []  Marissa Bolton, PharmD Marissa Bolton PharmD  Positive urine culture Treated with cephalexin, organism sensitive to the same and no further patient follow-up is required at this time.  Marissa Bolton, Marissa Bolton 04/18/2018, 10:06 AM

## 2019-01-05 IMAGING — DX DG CHEST 2V
2 series · 2 of 2 positions shown · non-contrast
Comparison: Two days ago

CLINICAL DATA: Chest pain

EXAM:
CHEST  2 VIEW

[x chest ap]
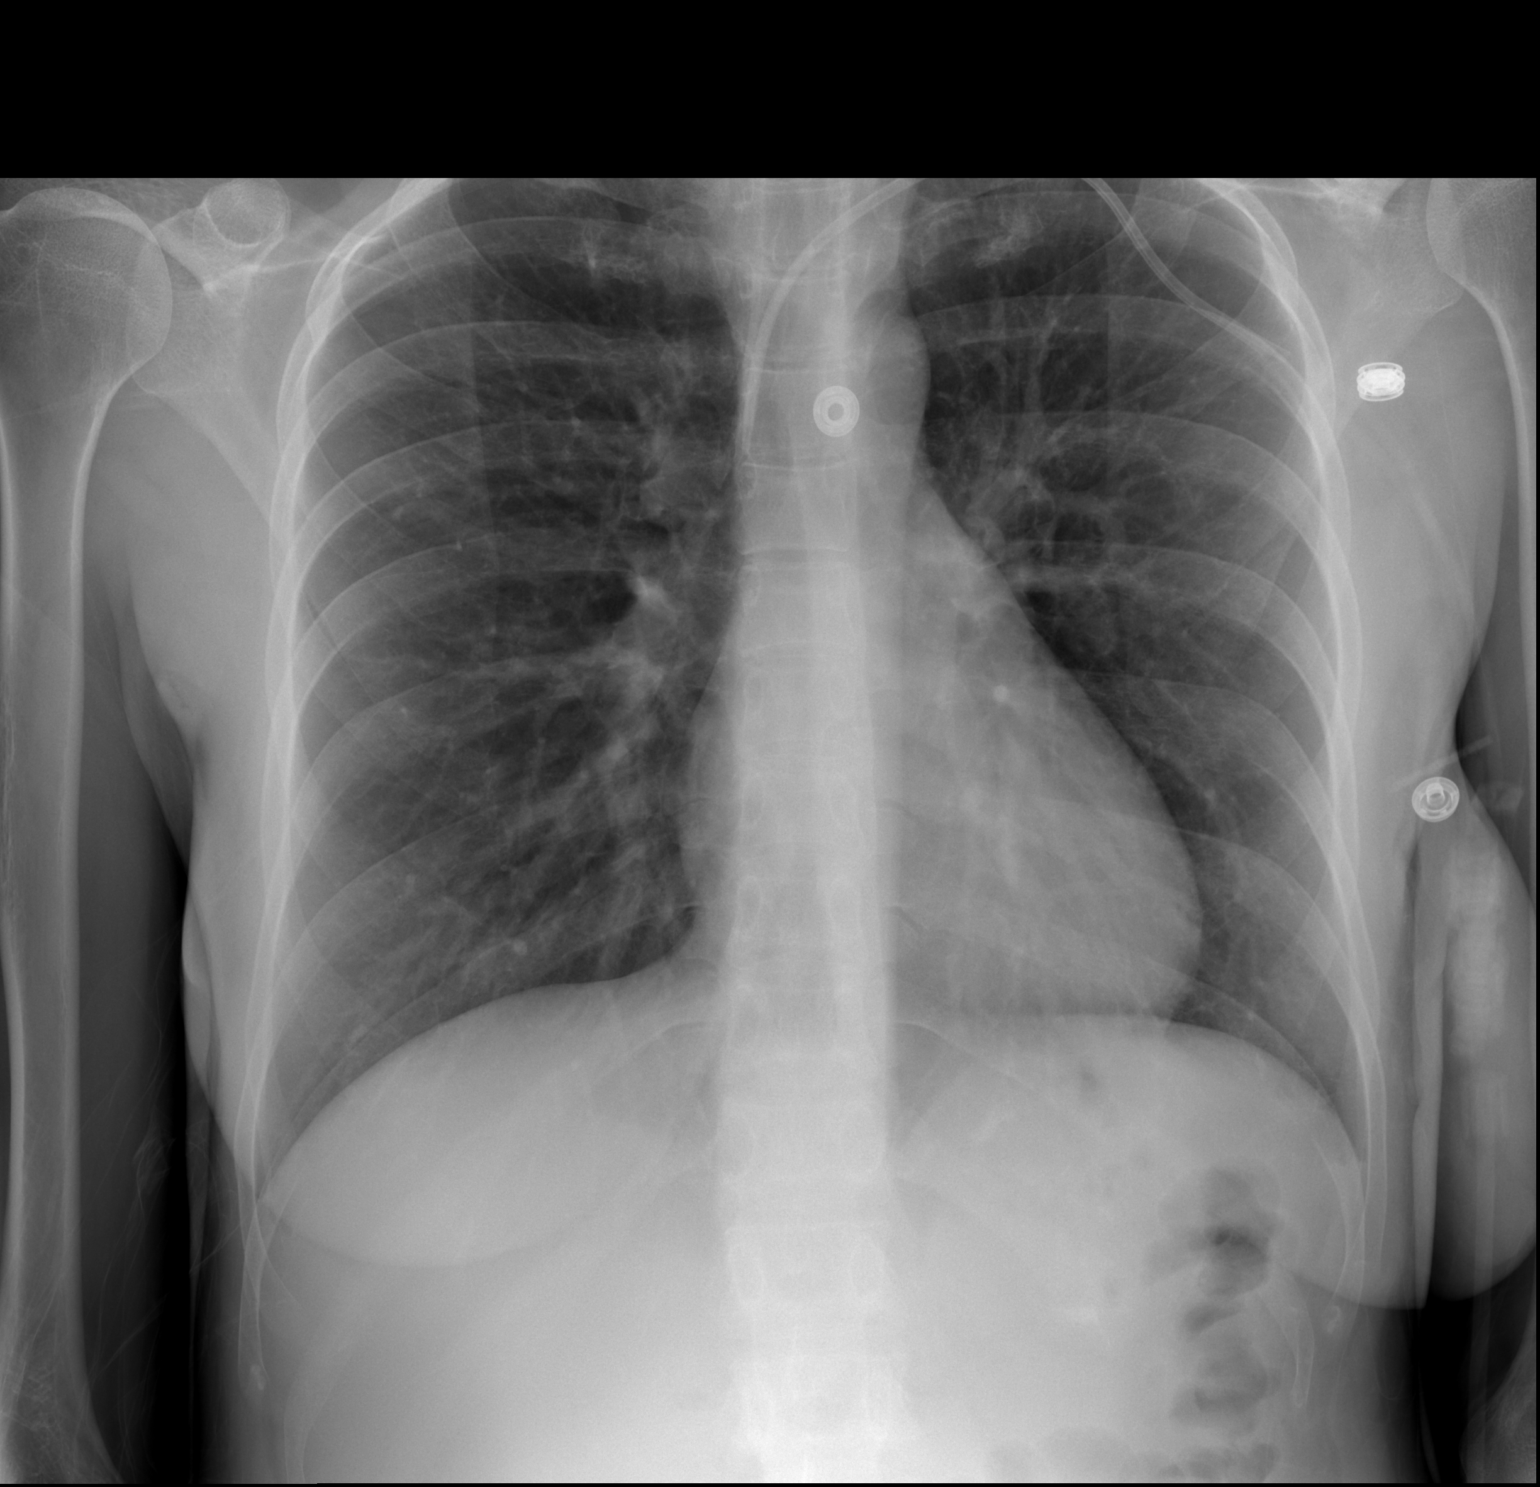

[w chest lat]
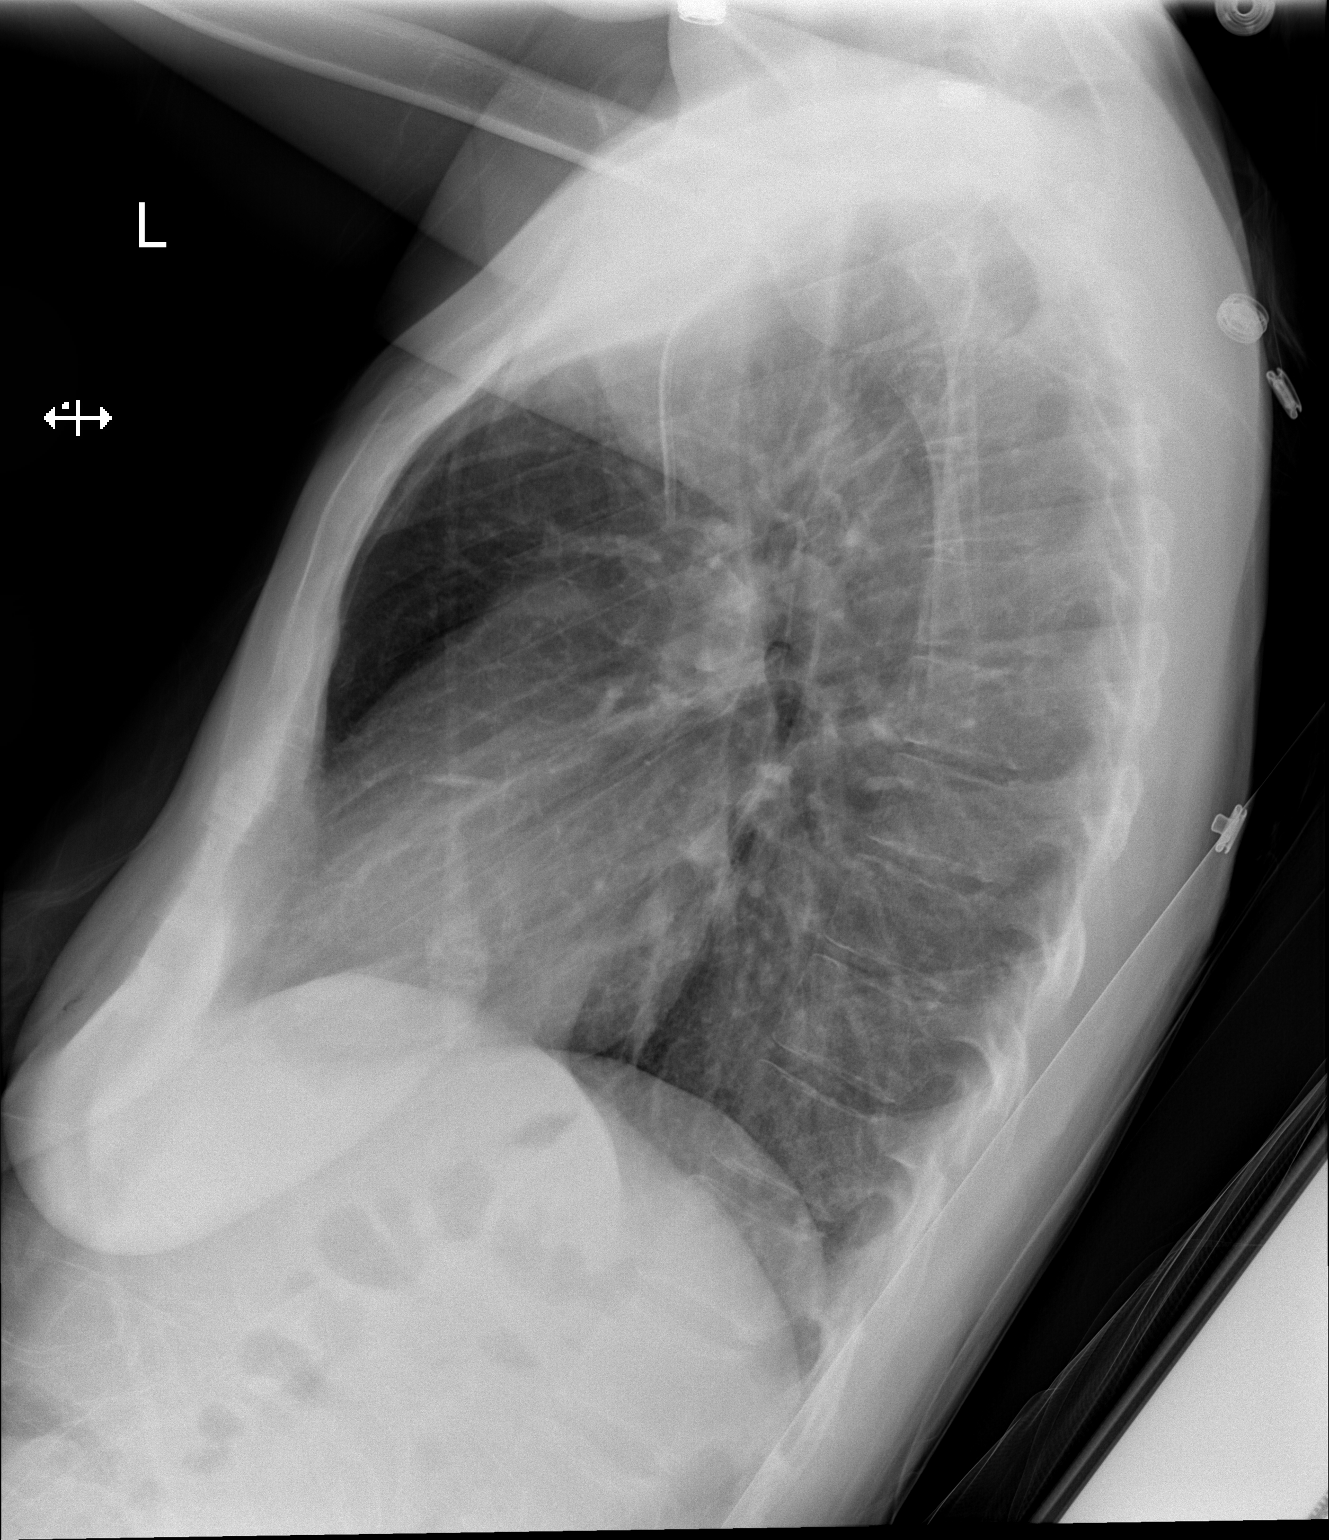

[2 of 2 positions shown; findings below may reference images not displayed]

FINDINGS: Subclavian line on the left with tip at the upper SVC. Normal heart
size and mediastinal contours. No acute infiltrate or edema. No
effusion or pneumothorax. No acute osseous findings.
IMPRESSION: No evidence of active disease.
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
## 2019-05-02 ENCOUNTER — Other Ambulatory Visit: Payer: Self-pay

## 2019-05-02 ENCOUNTER — Emergency Department (HOSPITAL_BASED_OUTPATIENT_CLINIC_OR_DEPARTMENT_OTHER)
Admission: EM | Admit: 2019-05-02 | Discharge: 2019-05-02 | Disposition: A | Discharge status: Home / Self Care | Payer: Medicaid Other | Attending: Emergency Medicine | Admitting: Emergency Medicine

## 2019-05-02 DIAGNOSIS — F17210 Nicotine dependence, cigarettes, uncomplicated: Secondary | ICD-10-CM | POA: Insufficient documentation

## 2019-05-02 DIAGNOSIS — J45909 Unspecified asthma, uncomplicated: Secondary | ICD-10-CM | POA: Insufficient documentation

## 2019-05-02 DIAGNOSIS — R21 Rash and other nonspecific skin eruption: Secondary | ICD-10-CM | POA: Insufficient documentation

## 2019-05-02 NOTE — ED Notes (Signed)
ED Provider at bedside. 

## 2019-05-02 NOTE — Discharge Instructions (Signed)
°  Cleaning: Clean the wound and surrounding area gently with tap water and mild soap. Rinse well and blot dry. You may shower normally. Soaking the wound in Epsom salt baths for no more than 15 minutes once a day may help rinse out any remaining pus and help with wound healing.  Clean the wound daily to prevent further infection. Do not use cleaners such as hydrogen peroxide or alcohol.   Sun exposure: Keep the wound out of the sun. After the wound has healed, continue to protect it from the sun by wearing protective clothing or applying sunscreen.  Pain: You may use Tylenol, naproxen, or ibuprofen for pain.  Prevention: There is some people that have a predisposition to abscess formation, however, there are some things that can be done to prevent abscesses in many people.  Most abscesses form because bacteria that naturally lives on the skin gets trapped underneath the skin.  This can occur through openings too small to see. Before and after any area of skin is shaved, wax, or abraded in any manner, the area should be washed with soap and water and rinsed well.   If you are having trouble with recurrent abscesses, it may be wise to perform a chlorhexidine wash regimen.  For 1 week, wash all of your body with chlorhexidine (available over-the-counter at most pharmacies). You may also need to reevaluate your use of daily soap as soaps with perfumes or dyes can increase the chances of infection in some people.  Follow up: Follow-up with a primary care provider or dermatologist for further management.  Return: Return to the ED sooner should signs of worsening infection arise, such as spreading redness, worsening puffiness/swelling, severe increase in pain, fever over 100.50F, or any other major issues.  For prescription assistance, may try using prescription discount sites or apps, such as goodrx.com

## 2019-05-02 NOTE — ED Triage Notes (Signed)
Pt c/o rash on back of head,right arm and back. Also reports pain on head at rash Pt reports taking abx x 2 weeks with no improvement. Denies fever. Normal gait
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
## 2019-05-02 NOTE — ED Notes (Signed)
Pt was walking out of department. Stopped by this RN and encouraged to return to room and wait for EDP. Pt agreeable at this time. RN assisted pt back to room and made comfortable. Pt states "I have to leave by 830 but the EDP wants me to do an ultrasound". EDP informed and awaiting plan.

## 2019-05-02 NOTE — ED Notes (Signed)
Pt informed RN she was leaving- RN asked EDP about pt continuing bactrim. EDP agreeable and pt informed. Pt escorted to registration and encouraged to follow up with PCP or return.
